# Patient Record
Sex: Male | Born: 1995 | Race: Black or African American | Hispanic: No | Marital: Single | Smoking: Never smoker
Health system: Southern US, Community
[De-identification: ages and names within clinical notes are randomized; demographics above are authoritative.]

## PROBLEM LIST (undated history)

## (undated) DIAGNOSIS — I10 Essential (primary) hypertension: Secondary | ICD-10-CM

## (undated) DIAGNOSIS — D573 Sickle-cell trait: Secondary | ICD-10-CM

---
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
## 2017-10-07 ENCOUNTER — Ambulatory Visit (INDEPENDENT_AMBULATORY_CARE_PROVIDER_SITE_OTHER): Payer: 59 | Admitting: Family Medicine

## 2017-10-07 ENCOUNTER — Encounter: Payer: Self-pay | Admitting: Family Medicine

## 2017-10-07 VITALS — BP 122/68 | HR 112 | Temp 99.4°F | Resp 18 | Ht 70.0 in | Wt 186.0 lb

## 2017-10-07 DIAGNOSIS — J069 Acute upper respiratory infection, unspecified: Secondary | ICD-10-CM | POA: Diagnosis not present

## 2017-10-07 DIAGNOSIS — J029 Acute pharyngitis, unspecified: Secondary | ICD-10-CM

## 2017-10-07 LAB — POC INFLUENZA A&B (BINAX/QUICKVUE)
Influenza A, POC: NEGATIVE
Influenza B, POC: NEGATIVE

## 2017-10-07 LAB — POCT RAPID STREP A (OFFICE): Rapid Strep A Screen: NEGATIVE

## 2017-10-07 NOTE — Progress Notes (Signed)
   2/4/20193:35 PM  William Lowe 01/11/1996, 22 y.o. male [id number]  Chief Complaint  Patient presents with  . Sore Throat    since Sunday with productive cough yellow sputum, subjective fevers and chills  . Otalgia    right ear fullness and pain since Sunday    HPI:   Patient is a 22 y.o. male who presents today for 2 days of sore throat, gets hot and cold, right ear pain. + Productive cough, yellow sputum. + nasal congestion. - SOB, nausea, vomiting, diarrhea, rashes Has been taking dayquil/nyquil and throat lozengers. Has not had flu vaccine this season. His mother has also been sick.   Depression screen PHQ 2/9 10/07/2017  Decreased Interest 0  Down, Depressed, Hopeless 0  PHQ - 2 Score 0    No Known Allergies  Prior to Admission medications   Not on File    History reviewed. No pertinent past medical history.  History reviewed. No pertinent surgical history.  Social History   Tobacco Use  . Smoking status: Never Smoker  . Smokeless tobacco: Never Used  Substance Use Topics  . Alcohol use: No    Frequency: Never    History reviewed. No pertinent family history.  ROS Per hpi  OBJECTIVE:  Blood pressure 122/68, pulse (!) 112, temperature 99.4 F (37.4 C), temperature source Oral, resp. rate 18, height 5\' 10"  (1.778 m), weight 186 lb (84.4 kg), SpO2 97 %.  Physical Exam  Constitutional: He is oriented to person, place, and time and well-developed, well-nourished, and in no distress.  HENT:  Head: Normocephalic and atraumatic.  Right Ear: Hearing, tympanic membrane, external ear and ear canal normal.  Left Ear: Hearing, tympanic membrane, external ear and ear canal normal.  Mouth/Throat: Mucous membranes are normal. Posterior oropharyngeal erythema present. No oropharyngeal exudate.  Eyes: Conjunctivae and EOM are normal. Pupils are equal, round, and reactive to light.  Neck: Neck supple.  Cardiovascular: Normal rate and regular rhythm. Exam reveals no  gallop and no friction rub.  No murmur heard. Pulmonary/Chest: Effort normal and breath sounds normal. He has no wheezes. He has no rales.  Lymphadenopathy:    He has no cervical adenopathy.  Neurological: He is alert and oriented to person, place, and time. Gait normal.  Skin: Skin is warm and dry.     Results for orders placed or performed in visit on 10/07/17 (from the past 24 hour(s))  POC Influenza A&B(BINAX/QUICKVUE)     Status: None   Collection Time: 10/07/17  3:48 PM  Result Value Ref Range   Influenza A, POC Negative Negative   Influenza B, POC Negative Negative  POCT rapid strep A     Status: None   Collection Time: 10/07/17  3:51 PM  Result Value Ref Range   Rapid Strep A Screen Negative Negative      ASSESSMENT and PLAN  1. URI with cough and congestion 2. Sore throat Discussed supportive measures for URI: increase hydration, rest, OTC medications, etc. RTC precautions discussed.  - POC Influenza A&B(BINAX/QUICKVUE) - POCT rapid strep A  Return if symptoms worsen or fail to improve.    Myles LippsIrma M Santiago, MD Primary Care at [facility] [address] [place], [place] [address] Ph.  [phone] Fax [phone]

## 2017-10-07 NOTE — Patient Instructions (Addendum)
     IF you received an x-ray today, you will receive an invoice from [facility]. Please contact [facility] at [phone] with questions or concerns regarding your invoice.   IF you received labwork today, you will receive an invoice from [facility]. Please contact [facility] at [phone] with questions or concerns regarding your invoice.   Our billing staff will not be able to assist you with questions regarding bills from these companies.  You will be contacted with the lab results as soon as they are available. The fastest way to get your results is to activate your My Chart account. Instructions are located on the last page of this paperwork. If you have not heard from us regarding the results in 2 weeks, please contact this office.     Upper Respiratory Infection, Adult Most upper respiratory infections (URIs) are caused by a virus. A URI affects the nose, throat, and upper air passages. The most common type of URI is often called "the common cold." Follow these instructions at home:  Take medicines only as told by your doctor.  Gargle warm saltwater or take cough drops to comfort your throat as told by your doctor.  Use a warm mist humidifier or inhale steam from a shower to increase air moisture. This may make it easier to breathe.  Drink enough fluid to keep your pee (urine) clear or pale yellow.  Eat soups and other clear broths.  Have a healthy diet.  Rest as needed.  Go back to work when your fever is gone or your doctor says it is okay. ? You may need to stay home longer to avoid giving your URI to others. ? You can also wear a face mask and wash your hands often to prevent spread of the virus.  Use your inhaler more if you have asthma.  Do not use any tobacco products, including cigarettes, chewing tobacco, or electronic cigarettes. If you need help quitting, ask your doctor. Contact a doctor if:  You are getting worse, not better.  Your  symptoms are not helped by medicine.  You have chills.  You are getting more short of breath.  You have brown or red mucus.  You have yellow or brown discharge from your nose.  You have pain in your face, especially when you bend forward.  You have a fever.  You have puffy (swollen) neck glands.  You have pain while swallowing.  You have white areas in the back of your throat. Get help right away if:  You have very bad or constant: ? Headache. ? Ear pain. ? Pain in your forehead, behind your eyes, and over your cheekbones (sinus pain). ? Chest pain.  You have long-lasting (chronic) lung disease and any of the following: ? Wheezing. ? Long-lasting cough. ? Coughing up blood. ? A change in your usual mucus.  You have a stiff neck.  You have changes in your: ? Vision. ? Hearing. ? Thinking. ? Mood. This information is not intended to replace advice given to you by your health care provider. Make sure you discuss any questions you have with your health care provider. Document Released: 02/06/2008 Document Revised: 04/22/2016 Document Reviewed: 11/25/2013 Elsevier Interactive Patient Education  2018 Elsevier Inc.  

## 2017-10-10 ENCOUNTER — Telehealth: Payer: Self-pay | Admitting: Family Medicine

## 2017-10-10 NOTE — Telephone Encounter (Signed)
Copied from CRM [id number]. Topic: General - Other >> Oct 10, 2017 10:44 AM Oneal GroutSebastian, Jennifer S wrote: Reason for CRM: Patient seen on Monday, he's throat is still hurting. Requesting something to be called in. [facility]

## 2017-10-10 NOTE — Telephone Encounter (Signed)
Pt's mother checking status of meds being called in

## 2017-10-10 NOTE — Telephone Encounter (Signed)
Patient's mom has called again and she's at the [facility] waiting on a prescription for her son to be called in.

## 2017-10-11 ENCOUNTER — Telehealth: Payer: Self-pay | Admitting: Family Medicine

## 2017-10-11 NOTE — Telephone Encounter (Signed)
Left detailed message with MD notes on vm per Hippa Release

## 2017-10-11 NOTE — Telephone Encounter (Signed)
Sorry but prednisone is not appropriate. He has a viral illness, supportive measures are indicated, such as pushing fluids and rest. If he is having high fevers, inability to maintain hydration, bloody diarrhea, shortness of breath, then he should be re-evaluated. Thanks

## 2017-10-11 NOTE — Telephone Encounter (Signed)
Caller name: Elyn AquasLinda Faison Relation to pt: mother  Call back number: [phone] Pharmacy: [facility]  Reason for call:  Mother 4x calling regarding requesting Prednisone, patient was seen 10/07/17 by Dr. Leretha PolSantiago, please advise

## 2017-10-11 NOTE — Telephone Encounter (Signed)
Copied from CRM [id number]. Topic: Quick Communication - See Telephone Encounter >> Oct 11, 2017  1:34 PM Cipriano BunkerLambe, Annette S wrote: CRM for notification. See Telephone encounter for:   Mom called saying his throat is hurting for 4 days now and not getting better.  Asking for something to help him.  Please call and let her know  [facility] [place] - [address] [place] [address] Phone: [phone] Fax: [phone]    10/11/17.

## 2017-10-11 NOTE — Telephone Encounter (Signed)
Patient was called because his mother called in to get something for his sore throat. I informed the patient and asked how was his throat feeling. He said "sometimes it's better, other times it's not." I advised that Dr. Leretha PolSantiago noted at the last OV 10/07/17 that if symptoms worsen or didn't improve to come back in to the office, I offered to make an appointment, he said "that's ok, I'm alright. If I don't get better, I will call you." I asked if he would call his mother to let her know his decision, he said "yes, I will."

## 2017-10-22 ENCOUNTER — Ambulatory Visit (INDEPENDENT_AMBULATORY_CARE_PROVIDER_SITE_OTHER): Payer: Worker's Compensation | Admitting: Urgent Care

## 2017-10-22 ENCOUNTER — Other Ambulatory Visit: Payer: Self-pay

## 2017-10-22 ENCOUNTER — Ambulatory Visit (INDEPENDENT_AMBULATORY_CARE_PROVIDER_SITE_OTHER): Payer: Worker's Compensation

## 2017-10-22 ENCOUNTER — Encounter: Payer: Self-pay | Admitting: Urgent Care

## 2017-10-22 VITALS — BP 122/78 | HR 106 | Temp 98.0°F | Resp 16 | Ht 69.69 in | Wt 191.0 lb

## 2017-10-22 DIAGNOSIS — S6702XA Crushing injury of left thumb, initial encounter: Secondary | ICD-10-CM | POA: Diagnosis not present

## 2017-10-22 DIAGNOSIS — Z026 Encounter for examination for insurance purposes: Secondary | ICD-10-CM

## 2017-10-22 DIAGNOSIS — S6392XA Sprain of unspecified part of left wrist and hand, initial encounter: Secondary | ICD-10-CM

## 2017-10-22 DIAGNOSIS — M79645 Pain in left finger(s): Secondary | ICD-10-CM | POA: Diagnosis not present

## 2017-10-22 DIAGNOSIS — M79642 Pain in left hand: Secondary | ICD-10-CM

## 2017-10-22 MED ORDER — MELOXICAM 7.5 MG PO TABS: 7.5000 mg | ORAL_TABLET | Freq: Every day | ORAL | 0 refills | Status: DC

## 2017-10-22 MED FILL — MELOXICAM 7.5 MG TABLET: 7.5 | 30 days supply | Qty: 30 | Fill #0

## 2017-10-22 NOTE — Progress Notes (Signed)
    MRN: [id number] DOB: 07/19/1996  Subjective:   William Lowe is a 22 y.o. male presenting for worker's comp visit. Reports suffering a left thumb/hand injury while trying to lift a couch. Patient lost control of the couch pulled his hand. Had pain of his left thumb, palm with some mild swelling. Denies bony deformity, warmth, redness.   Marsalis's medications list, allergies, past medical history and past surgical history were reviewed and excluded from this note due to being a worker's comp case.  Objective:   Vitals: BP 122/78   Pulse (!) 106   Temp 98 F (36.7 C) (Oral)   Resp 16   Ht 5' 9.69" (1.77 m)   Wt 191 lb (86.6 kg)   SpO2 98%   BMI 27.65 kg/m   Physical Exam  Constitutional: He is oriented to person, place, and time. He appears well-developed and well-nourished.  Cardiovascular: Normal rate.  Pulmonary/Chest: Effort normal.  Musculoskeletal:       Left hand: He exhibits normal range of motion, no tenderness, no bony tenderness, normal capillary refill, no deformity, no laceration and no swelling. Normal sensation noted. Normal strength noted.  Neurological: He is alert and oriented to person, place, and time.   Dg Finger Thumb Left  Result Date: 10/22/2017 CLINICAL DATA:  Injure left thumb at work yesterday. EXAM: LEFT THUMB 2+V COMPARISON:  None. FINDINGS: There is no evidence of fracture or dislocation. There is no evidence of arthropathy or other focal bone abnormality. Soft tissues are unremarkable IMPRESSION: Negative. Electronically Signed   By: Sherian ReinWei-Chen  Lin M.D.   On: 10/22/2017 13:43   Assessment and Plan :   Hand sprain, left, initial encounter  Pain of left thumb  Left hand pain - Plan: DG Finger Thumb Left  Encounter related to worker's compensation claim  Will start conservative management. Use meloxicam, rest, icing after work days. Return-to-clinic precautions discussed, patient verbalized understanding. Otherwise, follow up in 1 week.   Wallis BambergMario  Ivie Maese, PA-C Primary Care at [facility] [phone] 10/22/2017 1:32 PM

## 2017-10-22 NOTE — Patient Instructions (Addendum)
Intermetacarpal Sprain An intermetacarpal sprain happens when tissues between bones in the hand (metacarpals) become overstretched or torn(ruptured). This usually happens because of an injury to the hand. Intermetacarpal sprains range from mild to severe. They can take up to 2-12 weeks to heal, with proper treatment. What are the causes? This injury is caused by excess pressure or strain (stress) that is applied to the intermetacarpal ligaments. This often happens because of a hard, direct hit or injury (trauma) to the hand. What increases the risk? The following factors may make you more likely to develop this injury:  A previous hand injury.  Doing repetitive motions with your hands, such as movements in sports or heavy labor.  Having poor strength and flexibility in your hands.  What are the signs or symptoms? Symptoms of this injury may include:  A feeling of popping or tearing inside the hand.  Pain and inflammation, especially in the knuckles.  Bruising.  Limited range of motion of the hand.  How is this diagnosed? This injury is diagnosed based on a physical exam and your medical history. You may have X-rays to check for breaks (fractures) in your bones. Your sprain may be rated in degrees, based on how severe it is. The ratings include:  First-degree. A ligamentis stretched but it still has its normal shape.  Second-degree. A ligament is partially ruptured, and you may have some difficulty moving your hand normally.  Third-degree. A ligament is completely ruptured, and you may not be able to move the affected hand.  How is this treated? This injury is treated by resting, icing, raising (elevating), and applying pressure (compression) to the injured area. Depending on the severity of your sprain, treatment may also include:  Medicines that help to relieve pain.  Keeping your hand in a fixed position (immobilization) for a period of time. This may be done using a  bandage (dressing), a cast, or a splint.  Exercises to strengthen and stretch your hand. You may be referred to a physical therapist.  Surgery. This is rare.  Follow these instructions at home: If you have a cast:  Do not stick anything inside the cast to scratch your skin. Doing that increases your risk of infection.  Check the skin around the cast every day. Report any concerns to your health care provider. You may put lotion on dry skin around the edges of the cast. Do not apply lotion to the skin underneath the cast.  Do not let your cast get wet if it is not waterproof.  Keep the cast clean. If you have a splint:  Wear the splint as told by your health care provider. Remove it only as told by your health care provider.  Loosen the splint if your fingers tingle, become numb, or turn cold and blue.  Do not let your splint get wet if it is not waterproof.  Keep the splint clean. Bathing  If you have a cast, splint, or dressing, do not take baths, swim, or use a hot tub until your health care provider approves. Ask your health care provider if you can take showers. You may only be allowed to take sponge baths for bathing.  If you have a cast or splint that is not waterproof, cover it with a watertight plastic bag when you take a bath or a shower. Managing pain, stiffness, and swelling  If directed, apply ice to the injured area: ? Put ice in a plastic bag. ? Place a towel between your skin  and the bag. ? Leave the ice on for 20 minutes, 2-3 times per day.  Move your fingers often to avoid stiffness and to lessen swelling.  Elevate your hand above the level of your heart while you are sitting or lying down.  Wear a compression wrap only as told by your health care provider. Driving  Do not drive or operate heavy machinery while taking prescription pain medicine.  Ask your health care provider when it is safe to drive if you have a cast or splint on a hand that you use  for driving. Activity  Return to your normal activities as told by your health care provider. Ask your health care provider what activities are safe for you.  Avoid activities that cause pain or make your condition worse.  Do exercises as told by your health care provider or physical therapist. General instructions  Take over-the-counter and prescription medicines only as told by your health care provider.  If you have a cast or a splint, do not put pressure on any part of the cast or splint until it is fully hardened. This may take several hours.  Do not wear rings on the fingers of your injured hand.  Keep all follow-up visits as told by your health care provider. This is important. Contact a health care provider if:  You have symptoms that do not get better after 2 weeks of treatment.  You have more redness, swelling, or pain in your injured area.  You have a fever.  Your cast or splint gets damaged. Get help right away if:  You have severe pain.  You develop numbness in your hand or fingers.  You cannot move your hand or fingers.  Your hand or fingers feel unusually cold.  Your hand or fingers turn blue.  Your fingernails turn a dark color, such as blue or gray. This information is not intended to replace advice given to you by your health care provider. Make sure you discuss any questions you have with your health care provider. Document Released: 08/20/2005 Document Revised: 01/26/2016 Document Reviewed: 02/07/2015 Elsevier Interactive Patient Education  2018 [employer]     IF you received an x-ray today, you will receive an invoice from [facility]. Please contact [facility] at [phone] with questions or concerns regarding your invoice.   IF you received labwork today, you will receive an invoice from [facility]. Please contact [facility] at [phone] with questions or concerns regarding your invoice.   Our billing staff will not be  able to assist you with questions regarding bills from these companies.  You will be contacted with the lab results as soon as they are available. The fastest way to get your results is to activate your My Chart account. Instructions are located on the last page of this paperwork. If you have not heard from us regarding the results in 2 weeks, please contact this office.

## 2017-10-29 ENCOUNTER — Encounter: Payer: Self-pay | Admitting: Urgent Care

## 2017-10-29 ENCOUNTER — Ambulatory Visit (INDEPENDENT_AMBULATORY_CARE_PROVIDER_SITE_OTHER): Payer: Worker's Compensation | Admitting: Urgent Care

## 2017-10-29 VITALS — BP 122/68 | HR 60 | Temp 97.8°F | Resp 18 | Ht 69.0 in | Wt 188.6 lb

## 2017-10-29 DIAGNOSIS — M79645 Pain in left finger(s): Secondary | ICD-10-CM | POA: Diagnosis not present

## 2017-10-29 DIAGNOSIS — S6392XD Sprain of unspecified part of left wrist and hand, subsequent encounter: Secondary | ICD-10-CM | POA: Diagnosis not present

## 2017-10-29 DIAGNOSIS — Z026 Encounter for examination for insurance purposes: Secondary | ICD-10-CM

## 2017-10-29 DIAGNOSIS — M79642 Pain in left hand: Secondary | ICD-10-CM

## 2017-10-29 NOTE — Patient Instructions (Addendum)
Intermetacarpal Sprain An intermetacarpal sprain happens when tissues between bones in the hand (metacarpals) become overstretched or torn(ruptured). This usually happens because of an injury to the hand. Intermetacarpal sprains range from mild to severe. They can take up to 2-12 weeks to heal, with proper treatment. What are the causes? This injury is caused by excess pressure or strain (stress) that is applied to the intermetacarpal ligaments. This often happens because of a hard, direct hit or injury (trauma) to the hand. What increases the risk? The following factors may make you more likely to develop this injury:  A previous hand injury.  Doing repetitive motions with your hands, such as movements in sports or heavy labor.  Having poor strength and flexibility in your hands.  What are the signs or symptoms? Symptoms of this injury may include:  A feeling of popping or tearing inside the hand.  Pain and inflammation, especially in the knuckles.  Bruising.  Limited range of motion of the hand.  How is this diagnosed? This injury is diagnosed based on a physical exam and your medical history. You may have X-rays to check for breaks (fractures) in your bones. Your sprain may be rated in degrees, based on how severe it is. The ratings include:  First-degree. A ligamentis stretched but it still has its normal shape.  Second-degree. A ligament is partially ruptured, and you may have some difficulty moving your hand normally.  Third-degree. A ligament is completely ruptured, and you may not be able to move the affected hand.  How is this treated? This injury is treated by resting, icing, raising (elevating), and applying pressure (compression) to the injured area. Depending on the severity of your sprain, treatment may also include:  Medicines that help to relieve pain.  Keeping your hand in a fixed position (immobilization) for a period of time. This may be done using a  bandage (dressing), a cast, or a splint.  Exercises to strengthen and stretch your hand. You may be referred to a physical therapist.  Surgery. This is rare.  Follow these instructions at home: If you have a cast:  Do not stick anything inside the cast to scratch your skin. Doing that increases your risk of infection.  Check the skin around the cast every day. Report any concerns to your health care provider. You may put lotion on dry skin around the edges of the cast. Do not apply lotion to the skin underneath the cast.  Do not let your cast get wet if it is not waterproof.  Keep the cast clean. If you have a splint:  Wear the splint as told by your health care provider. Remove it only as told by your health care provider.  Loosen the splint if your fingers tingle, become numb, or turn cold and blue.  Do not let your splint get wet if it is not waterproof.  Keep the splint clean. Bathing  If you have a cast, splint, or dressing, do not take baths, swim, or use a hot tub until your health care provider approves. Ask your health care provider if you can take showers. You may only be allowed to take sponge baths for bathing.  If you have a cast or splint that is not waterproof, cover it with a watertight plastic bag when you take a bath or a shower. Managing pain, stiffness, and swelling  If directed, apply ice to the injured area: ? Put ice in a plastic bag. ? Place a towel between your skin  and the bag. ? Leave the ice on for 20 minutes, 2-3 times per day.  Move your fingers often to avoid stiffness and to lessen swelling.  Elevate your hand above the level of your heart while you are sitting or lying down.  Wear a compression wrap only as told by your health care provider. Driving  Do not drive or operate heavy machinery while taking prescription pain medicine.  Ask your health care provider when it is safe to drive if you have a cast or splint on a hand that you use  for driving. Activity  Return to your normal activities as told by your health care provider. Ask your health care provider what activities are safe for you.  Avoid activities that cause pain or make your condition worse.  Do exercises as told by your health care provider or physical therapist. General instructions  Take over-the-counter and prescription medicines only as told by your health care provider.  If you have a cast or a splint, do not put pressure on any part of the cast or splint until it is fully hardened. This may take several hours.  Do not wear rings on the fingers of your injured hand.  Keep all follow-up visits as told by your health care provider. This is important. Contact a health care provider if:  You have symptoms that do not get better after 2 weeks of treatment.  You have more redness, swelling, or pain in your injured area.  You have a fever.  Your cast or splint gets damaged. Get help right away if:  You have severe pain.  You develop numbness in your hand or fingers.  You cannot move your hand or fingers.  Your hand or fingers feel unusually cold.  Your hand or fingers turn blue.  Your fingernails turn a dark color, such as blue or gray. This information is not intended to replace advice given to you by your health care provider. Make sure you discuss any questions you have with your health care provider. Document Released: 08/20/2005 Document Revised: 01/26/2016 Document Reviewed: 02/07/2015 Elsevier Interactive Patient Education  2018 Elsevier Inc.     IF you received an x-ray today, you will receive an invoice from [facility]. Please contact [facility] at [phone] with questions or concerns regarding your invoice.   IF you received labwork today, you will receive an invoice from [facility]. Please contact [facility] at [phone] with questions or concerns regarding your invoice.   Our billing staff will not be  able to assist you with questions regarding bills from these companies.  You will be contacted with the lab results as soon as they are available. The fastest way to get your results is to activate your My Chart account. Instructions are located on the last page of this paperwork. If you have not heard from us regarding the results in 2 weeks, please contact this office.      

## 2017-10-29 NOTE — Progress Notes (Signed)
   MRN: [id number] DOB: 01/26/1996  Subjective:   Wynetta FinesDante M Weihe is a 22 y.o. male presenting for worker's comp visit. Reports significant improvement in his pain of his left thumb. Denies fever, redness, bony deformity, swelling, warmth. He is using meloxicam with good results.  Shizuo's medications list, allergies, past medical history and past surgical history were reviewed and excluded from this note due to being a worker's comp case.  Objective:   Vitals: BP 122/68   Pulse 60   Temp 97.8 F (36.6 C) (Oral)   Resp 18   Ht 5\' 9"  (1.753 m)   Wt 188 lb 9.6 oz (85.5 kg)   SpO2 100%   BMI 27.85 kg/m   Physical Exam  Constitutional: He is oriented to person, place, and time. He appears well-developed and well-nourished.  Cardiovascular: Normal rate.  Pulmonary/Chest: Effort normal.  Musculoskeletal:       Left hand: He exhibits normal range of motion, no tenderness, no bony tenderness, normal capillary refill, no deformity, no laceration and no swelling. Normal sensation noted. Normal strength noted.  Neurological: He is alert and oriented to person, place, and time.   Assessment and Plan :   Hand sprain, left, subsequent encounter  Pain of left thumb  Left hand pain  Encounter related to worker's compensation claim  Follow up as needed. Work restrictions lifted. Use meloxicam as needed.  Wallis BambergMario Aneeka Bowden, PA-C [facility] [phone] 10/29/2017 1:51 PM

## 2018-06-09 IMAGING — DX DG FINGER THUMB 2+V*L*
3 series · 3 of 3 positions shown · non-contrast
Comparison: None.

CLINICAL DATA: Injure left thumb at work yesterday.

EXAM:
LEFT THUMB 2+V

[finger ap]
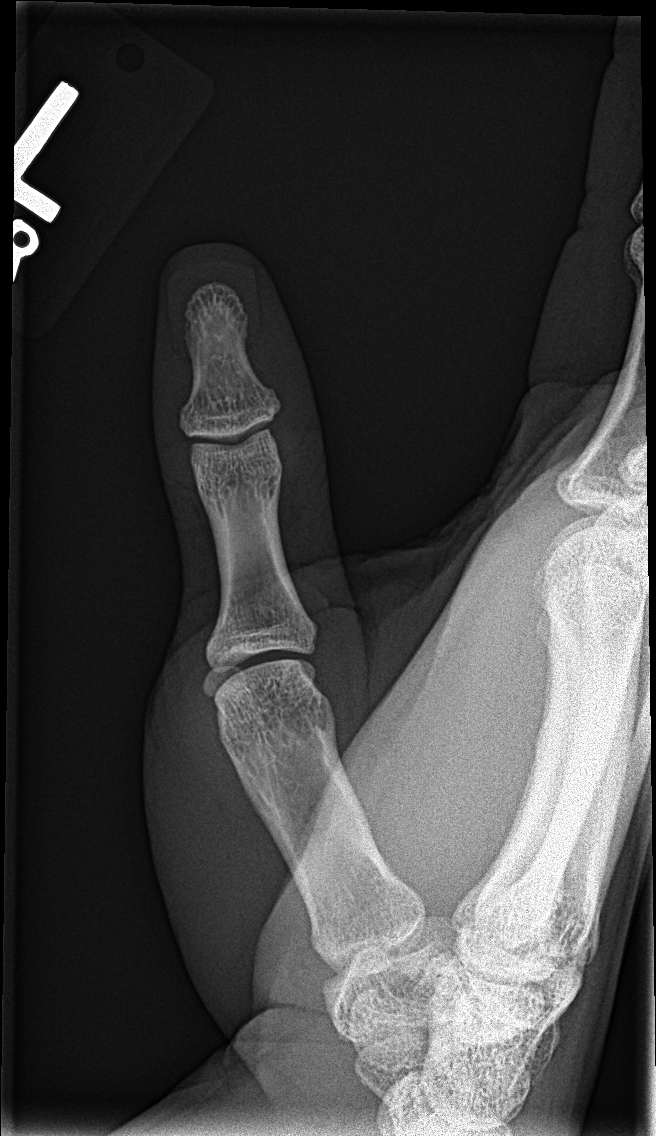

[finger obl]
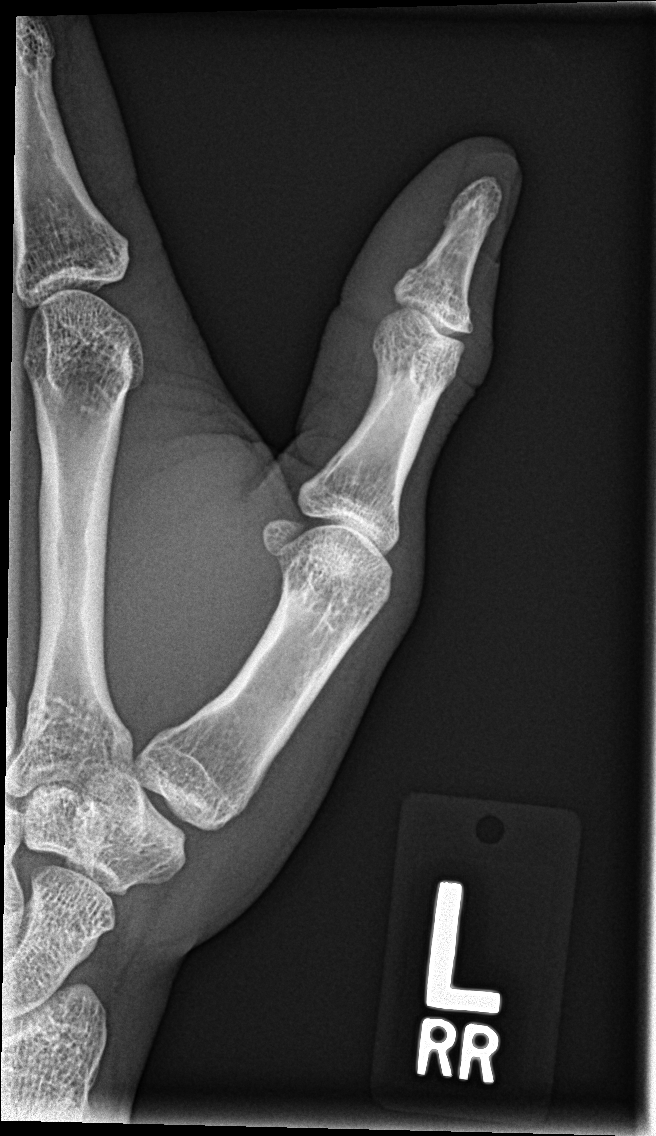

[finger lat]
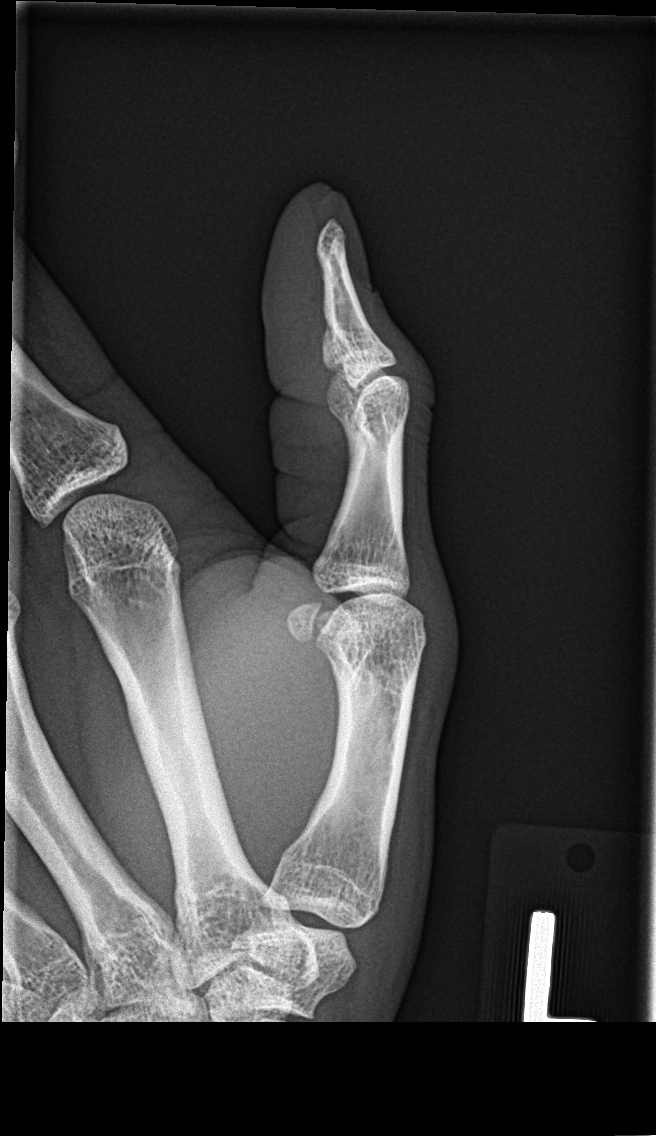

[3 of 3 positions shown; findings below may reference images not displayed]

FINDINGS: There is no evidence of fracture or dislocation. There is no
evidence of arthropathy or other focal bone abnormality. Soft
tissues are unremarkable
IMPRESSION: Negative.

## 2018-07-16 ENCOUNTER — Ambulatory Visit: Payer: Self-pay | Admitting: Emergency Medicine

## 2018-07-16 ENCOUNTER — Encounter: Payer: Self-pay | Admitting: Emergency Medicine

## 2018-07-16 ENCOUNTER — Other Ambulatory Visit: Payer: Self-pay

## 2018-07-16 VITALS — BP 121/75 | HR 88 | Temp 98.4°F | Resp 16 | Ht 68.5 in | Wt 185.2 lb

## 2018-07-16 DIAGNOSIS — Z024 Encounter for examination for driving license: Secondary | ICD-10-CM

## 2018-07-16 NOTE — Progress Notes (Signed)
This patient presents for DOT examination for fitness for duty.   Medical History:  1. Head/Brain Injuries, disorders or illnesses no  2. Seizures, epilepsy no  3. Eye disorders or impaired vision (except corrective lenses) no  4. Ear disorders, loss of hearing or balance no  5. Heart disease or heart attack, other cardiovascular condition no  6. Heart surgery (valve replacement/bypass, angioplasty, pacemaker/defribrillator) no  7. High blood pressure no  8. High cholesterol no  9. Chronic cough, shortness of breath or other breathing problems no  10. Lung disease (emphysema, asthma or chronic bronchitis) no  11. Kidney disease, dialysis no  12. Digestive problems  no  13. Diabetes or elevated blood sugar no                      If yes to #13, Insulin use no  14. Nervious or psychiatric disorders, e.g., severe depression no  15. Fainting or syncope no  16. Dizziness, headaches, numbness, tingling or memory loss no  17. Unexplained weight loss no  18. Stroke, TIA or paralysis no  19. Missing or impaired hand, arm, foot, leg, finger, toe no  20. Spinal injury or disease no  21. Bone, muscles or nerve problems no  22. Blood clots or bleeding bleeding disorders no  23. Cancer no  24. Chronic infection or other chronic diseases no  25. Sleep disorders, pauses in breathing while asleep, daytime sleepiness, loud snoring no  26. Have you ever had a sleep test? no  27.  Have you ever spent a night in the hospital? no  28. Have you ever had a broken bone? no  29. Have you or or do you use tobacco products? no  30. Regular, frequent alcohol use no  31. Illegal substance use within the past 2 years no  32.  Have you ever failed a drug test or been dependent on an illegal substance? no   Current Medications: Prior to Admission medications   Medication Sig Start Date End Date Taking? Authorizing Provider  meloxicam (MOBIC) 7.5 MG tablet Take 1 tablet (7.5 mg total) by mouth daily.  10/22/17   Wallis BambergMani, Mario, PA-C    Medical Examiner's Comments on Health History:  In good general medical condition.  TESTING:   Visual Acuity Screening   Right eye Left eye Both eyes  Without correction: 20/20 20/20 20/20   With correction:     Comments: Colors:  6/6 Periph: R eye 85 degrees  L eye 85 degrees  Hearing Screening Comments: Whisper test: R ear 10 ft  L ear 10 ft  Monocular Vision: No.  Hearing Aid used for test: No. Hearing Aid required to to meet standard: No.  BP 121/75   Pulse 88   Temp 98.4 F (36.9 C) (Oral)   Resp 16   Ht 5' 8.5" (1.74 m)   Wt 185 lb 3.2 oz (84 kg)   SpO2 97%   BMI 27.75 kg/m  Pulse rate is regular     PHYSICAL EXAMINATION:  General Appearance Not markedly obese. No tremor, signs of alcoholism, problem drinking or drug abuse.   Skin Warm, dry and intact.  Eyes Pupils are equal, round and reactive to light and accommodation, extraocular movements are intact. No exophthalmos, no nystagmus.  Ears Normal external ears. External canal without occlusion. No scarring of the TM. No perforation of the TM.  Mouth and Throat Clear and moist. No irremedial deformities likely to interfere with breathing or  swallowing.  Heart No murmurs, extra sounds, evidence of cardiomegaly. No pacemaker. No implantable defibrillator.  Lungs and Chest (excluding breasts) Normal chest expansion, respiratory rate, breath sounds. No cyanosis.  Abdomen and Viscera No liver enlargement. No splenic enlargement. No masses, bruits, hernias or significant abdominal wall weakness.  Genitourinary  No inguinal or femoral hernia.  Spine and other musculoskeletal No tenderness, no limitation of motion, no deformities. No evidence of previous surgery.  Extremities No loss or impairment of leg, foot, toe, arm, hand, finger. No perceptible limp, deformities, atrophy, weakness, paralysis, clubbing, edema, hypotonia. Patient has sufficient grasp and prehension to maintain steering  wheel grip. Patient has sufficient mobility and strength in the lower limbs to operate pedals properly.  Neurologic Normal equilibrium, coordination, speech pattern. No paresthesia, asymmetry of deep tendon reflexes, sensory or positional abnormalities. No abnormality of patellar or Babinski's reflexes.  Gait Not antalgic or ataxic  Vascular Normal pulses. No carotid or arterial bruits. No varicose veins.     Certification Status: does meet standards for 2 year certificate.  Certification expires 07/16/2020.

## 2018-07-16 NOTE — Patient Instructions (Addendum)

## 2018-08-14 ENCOUNTER — Encounter (HOSPITAL_COMMUNITY): Payer: Self-pay | Admitting: Emergency Medicine

## 2018-08-14 ENCOUNTER — Other Ambulatory Visit: Payer: Self-pay

## 2018-08-14 ENCOUNTER — Ambulatory Visit (HOSPITAL_COMMUNITY)
Admission: EM | Admit: 2018-08-14 | Discharge: 2018-08-14 | Disposition: A | Discharge status: Home / Self Care | Payer: 59 | Attending: Family Medicine | Admitting: Family Medicine

## 2018-08-14 DIAGNOSIS — R69 Illness, unspecified: Secondary | ICD-10-CM | POA: Diagnosis not present

## 2018-08-14 DIAGNOSIS — J111 Influenza due to unidentified influenza virus with other respiratory manifestations: Secondary | ICD-10-CM

## 2018-08-14 DIAGNOSIS — R05 Cough: Secondary | ICD-10-CM | POA: Insufficient documentation

## 2018-08-14 DIAGNOSIS — R059 Cough, unspecified: Secondary | ICD-10-CM

## 2018-08-14 MED ORDER — HYDROCODONE-HOMATROPINE 5-1.5 MG/5ML PO SYRP: 5.0000 mL | ORAL_SOLUTION | Freq: Four times a day (QID) | ORAL | 0 refills | Status: AC | PRN

## 2018-08-14 MED ORDER — IBUPROFEN 800 MG PO TABS
800.0000 mg | ORAL_TABLET | Freq: Once | ORAL | Status: AC
  Administered 2018-08-14: 800 mg via ORAL

## 2018-08-14 MED ORDER — IBUPROFEN 800 MG PO TABS
ORAL_TABLET | ORAL | Status: AC
  Filled 2018-08-14: qty 1

## 2018-08-14 NOTE — Discharge Instructions (Signed)

## 2018-08-14 NOTE — ED Triage Notes (Signed)
PT reports cough, congestion, fever, bodyaches since Monday.

## 2018-08-14 NOTE — ED Provider Notes (Signed)
[facility]   [id number] 08/14/18 Arrival Time: 1706  ASSESSMENT & PLAN:  1. Influenza-like illness   2. Cough   See AVS for d/c instructions.  Meds ordered this encounter  Medications  . ibuprofen (ADVIL,MOTRIN) tablet 800 mg  . HYDROcodone-homatropine (HYCODAN) 5-1.5 MG/5ML syrup    Sig: Take 5 mLs by mouth every 6 (six) hours as needed for cough.    Dispense:  90 mL    Refill:  0   Cough medication sedation precautions. Discussed typical duration of symptoms. OTC symptom care as needed. Ensure adequate fluid intake and rest. May f/u with PCP or here as needed.  Reviewed expectations re: course of current medical issues. Questions answered. Outlined signs and symptoms indicating need for more acute intervention. Patient verbalized understanding. After Visit Summary given.   SUBJECTIVE: History from: patient.  William Lowe is a 22 y.o. male who presents with complaint of nasal congestion, post-nasal drainage, and a persistent dry cough. Onset abrupt, about 3 days ago. Overall with fatigue and with body aches. SOB: none. Wheezing: none. Fever: yes, subjective with chills. Overall normal PO intake without n/v. Sick contacts: no. No specific or significant aggravating or alleviating factors reported. OTC treatment: none reported.  Received flu shot this year: no.  Social History   Tobacco Use  Smoking Status Never Smoker  Smokeless Tobacco Never Used    ROS: As per HPI.   OBJECTIVE:  Vitals:   08/14/18 1811  BP: (!) 120/56  Pulse: (!) 103  Resp: 16  Temp: (!) 103 F (39.4 C)  TempSrc: Oral  SpO2: 99%     General appearance: alert; appears fatigued HEENT: nasal congestion; clear runny nose; throat irritation secondary to post-nasal drainage Neck: supple without LAD Lungs: unlabored respirations, symmetrical air entry without wheezing; cough: moderate Psychological: alert and cooperative; normal mood and affect   No Known  Allergies  PMH: L hand sprain.  Social History   Socioeconomic History  . Marital status: Single    Spouse name: Not on file  . Number of children: Not on file  . Years of education: Not on file  . Highest education level: Not on file  Occupational History  . Not on file  Social Needs  . Financial resource strain: Not on file  . Food insecurity:    Worry: Not on file    Inability: Not on file  . Transportation needs:    Medical: Not on file    Non-medical: Not on file  Tobacco Use  . Smoking status: Never Smoker  . Smokeless tobacco: Never Used  Substance and Sexual Activity  . Alcohol use: No    Frequency: Never  . Drug use: No  . Sexual activity: Not on file  Lifestyle  . Physical activity:    Days per week: Not on file    Minutes per session: Not on file  . Stress: Not on file  Relationships  . Social connections:    Talks on phone: Not on file    Gets together: Not on file    Attends religious service: Not on file    Active member of club or organization: Not on file    Attends meetings of clubs or organizations: Not on file    Relationship status: Not on file  . Intimate partner violence:    Fear of current or ex partner: Not on file    Emotionally abused: Not on file    Physically abused: Not on file  Forced sexual activity: Not on file  Other Topics Concern  . Not on file  Social History Narrative  . Not on file           Mardella Layman, MD 08/15/18 [phone]

## 2018-08-14 NOTE — ED Triage Notes (Signed)
Wishes to see provider before a strep test is done

## 2018-08-17 ENCOUNTER — Other Ambulatory Visit: Payer: Self-pay

## 2018-08-17 ENCOUNTER — Emergency Department (HOSPITAL_COMMUNITY)
Admission: EM | Admit: 2018-08-17 | Discharge: 2018-08-17 | Disposition: A | Discharge status: Home / Self Care | Payer: 59 | Attending: Emergency Medicine | Admitting: Emergency Medicine

## 2018-08-17 ENCOUNTER — Encounter (HOSPITAL_COMMUNITY): Payer: Self-pay | Admitting: Emergency Medicine

## 2018-08-17 DIAGNOSIS — B370 Candidal stomatitis: Secondary | ICD-10-CM | POA: Diagnosis not present

## 2018-08-17 DIAGNOSIS — Z79899 Other long term (current) drug therapy: Secondary | ICD-10-CM | POA: Insufficient documentation

## 2018-08-17 DIAGNOSIS — M791 Myalgia, unspecified site: Secondary | ICD-10-CM | POA: Diagnosis not present

## 2018-08-17 DIAGNOSIS — K1321 Leukoplakia of oral mucosa, including tongue: Secondary | ICD-10-CM | POA: Diagnosis present

## 2018-08-17 MED ORDER — NYSTATIN 100000 UNIT/ML MT SUSP: 500000.0000 [IU] | Freq: Four times a day (QID) | OROMUCOSAL | 0 refills | Status: DC

## 2018-08-17 MED ORDER — FLUCONAZOLE 200 MG PO TABS
200.0000 mg | ORAL_TABLET | Freq: Once | ORAL | Status: AC
  Administered 2018-08-17: 200 mg via ORAL
  Filled 2018-08-17: qty 1

## 2018-08-17 NOTE — Discharge Instructions (Signed)
Put the medication on your tongue and let it soak for a minute and then swallow. Call your doctor tomorrow for follow up.

## 2018-08-17 NOTE — ED Provider Notes (Signed)
[facility]-EMERGENCY DEPT Provider Note   CSN: [id number] Arrival date & time: 08/17/18  1849     History   Chief Complaint No chief complaint on file.   HPI William Lowe is a 22 y.o. male who presents to the ED for a coated tongue. Patient reports his tongue is sore. He has had the flu for the past week and was evaluated and treated at Urgent Care but he can not get rid of the coating on his tongue that he thinks is thrush. Patient denies having any immuno comprising conditions.  Patient reports he has brushed his tongue and teeth but can not get the coating off his tongue.   HPI  History reviewed. No pertinent past medical history.  There are no active problems to display for this patient.   History reviewed. No pertinent surgical history.      Home Medications    Prior to Admission medications   Medication Sig Start Date End Date Taking? Authorizing Provider  HYDROcodone-homatropine (HYCODAN) 5-1.5 MG/5ML syrup Take 5 mLs by mouth every 6 (six) hours as needed for cough. 08/14/18   Mardella LaymanHagler, Brian, MD  meloxicam (MOBIC) 7.5 MG tablet Take 1 tablet (7.5 mg total) by mouth daily. 10/22/17   Wallis BambergMani, Mario, PA-C  nystatin (MYCOSTATIN) 100000 UNIT/ML suspension Take 5 mLs (500,000 Units total) by mouth 4 (four) times daily. 08/17/18   Janne NapoleonNeese, Cloa Bushong M, NP    Family History No family history on file.  Social History Social History   Tobacco Use  . Smoking status: Never Smoker  . Smokeless tobacco: Never Used  Substance Use Topics  . Alcohol use: No    Frequency: Never  . Drug use: No     Allergies   Patient has no known allergies.   Review of Systems Review of Systems  Constitutional:       Patient reports he continues to have mild cough and body aches but has improved significantly since dx with flu.   HENT:       Coated tongue and sore tongue  Musculoskeletal: Positive for myalgias.     Physical Exam Updated Vital Signs BP (!)  142/87 (BP Location: Right Arm)   Pulse 92   Temp 99.2 F (37.3 C) (Oral)   Resp 18   SpO2 99%   Physical Exam Vitals signs and nursing note reviewed.  Constitutional:      Appearance: He is well-developed.  HENT:     Nose: Congestion present.     Mouth/Throat:     Mouth: Mucous membranes are moist.     Tongue: Tongue lesions: thick white coating      Pharynx: Oropharynx is clear.  Neck:     Musculoskeletal: Neck supple. No neck rigidity.  Cardiovascular:     Rate and Rhythm: Normal rate and regular rhythm.  Pulmonary:     Effort: Pulmonary effort is normal.     Breath sounds: Normal breath sounds.  Musculoskeletal: Normal range of motion.  Lymphadenopathy:     Cervical: No cervical adenopathy.  Skin:    General: Skin is warm and dry.  Neurological:     Mental Status: He is alert and oriented to person, place, and time.     Cranial Nerves: No cranial nerve deficit.  Psychiatric:        Mood and Affect: Mood normal.      ED Treatments / Results  Labs (all labs ordered are listed, but only abnormal results are displayed) Labs Reviewed -  No data to display  Radiology No results found.  Procedures Procedures (including critical care time)  Medications Ordered in ED Medications  fluconazole (DIFLUCAN) tablet 200 mg (200 mg Oral Given 08/17/18 2040)     Initial Impression / Assessment and Plan / ED Course  I have reviewed the triage vital signs and the nursing notes. 22 y.o. male here with pain and coating of his tongue stable for d/c. Discussed in detail with the patient possible causes of thrush and need for f/u with PCP. Offered patient HIV screening and he declined stating that he will see is PCP tomorrow and discuss plan.   Final Clinical Impressions(s) / ED Diagnoses   Final diagnoses:  Thrush, oral    ED Discharge Orders         Ordered    nystatin (MYCOSTATIN) 100000 UNIT/ML suspension  4 times daily     08/17/18 2045           Kerrie Buffalo  [place], [place] 08/17/18 2050    Tilden Fossa, MD 08/20/18 0900

## 2018-08-17 NOTE — ED Triage Notes (Signed)
Patient c/o tongue pain x2days. Tongue appears white.

## 2018-09-21 ENCOUNTER — Emergency Department (HOSPITAL_COMMUNITY)
Admission: EM | Admit: 2018-09-21 | Discharge: 2018-09-21 | Disposition: A | Discharge status: Home / Self Care | Payer: 59 | Attending: Emergency Medicine | Admitting: Emergency Medicine

## 2018-09-21 ENCOUNTER — Encounter (HOSPITAL_COMMUNITY): Payer: Self-pay | Admitting: *Deleted

## 2018-09-21 ENCOUNTER — Other Ambulatory Visit: Payer: Self-pay

## 2018-09-21 DIAGNOSIS — L0390 Cellulitis, unspecified: Secondary | ICD-10-CM

## 2018-09-21 DIAGNOSIS — M79671 Pain in right foot: Secondary | ICD-10-CM | POA: Diagnosis present

## 2018-09-21 DIAGNOSIS — L03115 Cellulitis of right lower limb: Secondary | ICD-10-CM | POA: Diagnosis not present

## 2018-09-21 MED ORDER — CLINDAMYCIN HCL 300 MG PO CAPS: 300.0000 mg | ORAL_CAPSULE | Freq: Four times a day (QID) | ORAL | 0 refills | Status: DC

## 2018-09-21 MED ORDER — IBUPROFEN 800 MG PO TABS: 800.0000 mg | ORAL_TABLET | Freq: Three times a day (TID) | ORAL | 0 refills | Status: DC

## 2018-09-21 MED ORDER — KETOROLAC TROMETHAMINE 60 MG/2ML IM SOLN
60.0000 mg | Freq: Once | INTRAMUSCULAR | Status: AC
  Administered 2018-09-21: 60 mg via INTRAMUSCULAR
  Filled 2018-09-21: qty 2

## 2018-09-21 MED ORDER — CLINDAMYCIN HCL 300 MG PO CAPS
300.0000 mg | ORAL_CAPSULE | Freq: Once | ORAL | Status: AC
  Administered 2018-09-21: 300 mg via ORAL
  Filled 2018-09-21: qty 1

## 2018-09-21 NOTE — ED Triage Notes (Signed)
Pt c/o right foot pain to plantar surface that started Saturday.

## 2018-09-21 NOTE — ED Notes (Signed)
Bed: WTR5 Expected date:  Expected time:  Means of arrival:  Comments: 

## 2018-09-21 NOTE — ED Provider Notes (Signed)
Emergency Department Provider Note   I have reviewed the triage vital signs and the nursing notes.   HISTORY  Chief Complaint Foot Pain (right)   HPI William Lowe is a 23 y.o. male had a small swollen area to the heel of his right foot.  He tried "popping" and "digging it out" with a needle and worsening pain and did not improve so presented here for further evaluation.  No fevers, nausea, vomiting.  No other associated symptoms.  Has not tried any else for the symptoms.  Has not seen anyone else for the symptoms.  No pain or abnormal rashes anywhere else. No other associated or modifying symptoms.    History reviewed. No pertinent past medical history.  There are no active problems to display for this patient.   History reviewed. No pertinent surgical history.  Current Outpatient Rx  . Order #: [id number] Class: Print  . Order #: [id number] Class: Normal  . Order #: [id number] Class: Print  . Order #: [id number] Class: Normal  . Order #: [id number] Class: Print    Allergies Patient has no known allergies.  No family history on file.  Social History Social History   Tobacco Use  . Smoking status: Never Smoker  . Smokeless tobacco: Never Used  Substance Use Topics  . Alcohol use: No    Frequency: Never  . Drug use: No    Review of Systems  All other systems negative except as documented in the HPI. All pertinent positives and negatives as reviewed in the HPI. ____________________________________________   PHYSICAL EXAM:  VITAL SIGNS: ED Triage Vitals  Enc Vitals Group     BP 09/21/18 0339 (!) 146/74     Pulse Rate 09/21/18 0339 (!) 108     Resp 09/21/18 0339 18     Temp 09/21/18 0339 99.6 F (37.6 C)     Temp Source 09/21/18 0339 Oral     SpO2 09/21/18 0339 100 %     Weight 09/21/18 0339 185 lb (83.9 kg)     Height 09/21/18 0339 5\' 10"  (1.778 m)     Head Circumference --      Peak Flow --      Pain Score 09/21/18 0347 5     Pain Loc --      Pain Edu?  --      Excl. in GC? --     Constitutional: Alert and oriented. Well appearing and in no acute distress. Eyes: Conjunctivae are normal. PERRL. EOMI. Head: Atraumatic. Nose: No congestion/rhinnorhea. Mouth/Throat: Mucous membranes are moist.  Oropharynx non-erythematous. Neck: No stridor.  No meningeal signs.   Cardiovascular: Normal rate, regular rhythm. Good peripheral circulation. Grossly normal heart sounds.   Respiratory: Normal respiratory effort.  No retractions. Lungs CTAB. Gastrointestinal: Soft and nontender. No distention.  Musculoskeletal: No lower extremity tenderness nor edema. No gross deformities of extremities. Neurologic:  Normal speech and language. No gross focal neurologic deficits are appreciated.  Skin:  Skin is warm, dry and intact. Open area of skin to right heel with surrounding warmth, tenderness, erythema and induration that goes up his instep a small amount. No fluctuance.  ____________________________________________   INITIAL IMPRESSION / ASSESSMENT AND PLAN / ED COURSE  Suspect this is a wart or other benign etiology that now has superficial infection 2/2 broken skin. abx started.      Pertinent labs & imaging results that were available during my care of the patient were reviewed by me and considered in my medical decision making (see chart  for details).  ____________________________________________  FINAL CLINICAL IMPRESSION(S) / ED DIAGNOSES  Final diagnoses:  Cellulitis, unspecified cellulitis site     MEDICATIONS GIVEN DURING THIS VISIT:  Medications  clindamycin (CLEOCIN) capsule 300 mg (300 mg Oral Given 09/21/18 0421)  ketorolac (TORADOL) injection 60 mg (60 mg Intramuscular Given 09/21/18 0421)     NEW OUTPATIENT MEDICATIONS STARTED DURING THIS VISIT:  Discharge Medication List as of 09/21/2018  4:14 AM    START taking these medications   Details  clindamycin (CLEOCIN) 300 MG capsule Take 1 capsule (300 mg total) by mouth 4  (four) times daily. X 7 days, Starting Sun 09/21/2018, Print        Note:  This note was prepared with assistance of Dragon voice recognition software. Occasional wrong-word or sound-a-like substitutions may have occurred due to the inherent limitations of voice recognition software.   Mitesh Rosendahl, Barbara CowerJason, MD 09/21/18 [phone]

## 2018-09-22 DIAGNOSIS — M79671 Pain in right foot: Secondary | ICD-10-CM | POA: Diagnosis not present

## 2018-11-10 DIAGNOSIS — K5901 Slow transit constipation: Secondary | ICD-10-CM | POA: Diagnosis not present

## 2018-11-10 DIAGNOSIS — Z760 Encounter for issue of repeat prescription: Secondary | ICD-10-CM | POA: Diagnosis not present

## 2020-08-30 ENCOUNTER — Ambulatory Visit: Payer: 59

## 2020-09-01 ENCOUNTER — Other Ambulatory Visit: Payer: Self-pay

## 2020-09-01 ENCOUNTER — Inpatient Hospital Stay: Payer: Self-pay | Attending: Medical

## 2020-09-01 DIAGNOSIS — Z23 Encounter for immunization: Secondary | ICD-10-CM

## 2020-09-01 NOTE — Progress Notes (Signed)
   Covid-19 Vaccination Clinic  Name:  William Lowe    MRN: [id number] DOB: 1996-06-12  09/01/2020  William Lowe was observed post Covid-19 immunization for 15 minutes without incident. He was provided with Vaccine Information Sheet and instruction to access the V-Safe system.   William Lowe was instructed to call 911 with any severe reactions post vaccine: Marland Kitchen Difficulty breathing  . Swelling of face and throat  . A fast heartbeat  . A bad rash all over body  . Dizziness and weakness   Immunizations Administered    Name Date Dose VIS Date Route   Pfizer COVID-19 Vaccine 09/01/2020  2:38 PM 0.3 mL 06/22/2020 Intramuscular   Manufacturer: [employer], [employer]   Lot: [id number]   NDC: [address]-9

## 2020-11-09 NOTE — Progress Notes (Signed)
[facility] [address] [place] [address] Phone: [phone] Subjective:   William Lowe, am serving as a scribe for Dr. Antoine Primas. This visit occurred during the SARS-CoV-2 public health emergency.  Safety protocols were in place, including screening questions prior to the visit, additional usage of staff PPE, and extensive cleaning of exam room while observing appropriate contact time as indicated for disinfecting solutions.   I'm seeing this patient by the request  of:  Pa, [facility]  CC: back pain   [id number]  William Lowe is a 25 y.o. male coming in with complaint of back pain for past few months. Patient states that he has both lower and middle back pain. Denies any radiating symptoms. Patient has tried IBU and lays down when he hurts. No weakness.  Patient is very active.  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  Patient is highly active.      No past medical history on file. No past surgical history on file. Social History   Socioeconomic History  . Marital status: Single    Spouse name: Not on file  . Number of children: Not on file  . Years of education: Not on file  . Highest education level: Not on file  Occupational History  . Not on file  Tobacco Use  . Smoking status: Never Smoker  . Smokeless tobacco: Never Used  Vaping Use  . Vaping Use: Never used  Substance and Sexual Activity  . Alcohol use: No  . Drug use: No  . Sexual activity: Not on file  Other Topics Concern  . Not on file  Social History Narrative  . Not on file   Social Determinants of Health   Financial Resource Strain: Not on file  Food Insecurity: Not on file  Transportation Needs: Not on file  Physical Activity: Not on file  Stress: Not on file  Social Connections: Not on file   No Known Allergies No family history on file.    Current Outpatient Medications (Respiratory):   .  HYDROcodone-homatropine (HYCODAN) 5-1.5 MG/5ML syrup, Take 5 mLs by mouth every 6 (six) hours as needed for cough.  Current Outpatient Medications (Analgesics):  .  ibuprofen (ADVIL,MOTRIN) 800 MG tablet, Take 1 tablet (800 mg total) by mouth 3 (three) times daily. .  meloxicam (MOBIC) 7.5 MG tablet, Take 1 tablet (7.5 mg total) by mouth daily.   Current Outpatient Medications (Other):  .  clindamycin (CLEOCIN) 300 MG capsule, Take 1 capsule (300 mg total) by mouth 4 (four) times daily. X 7 days .  nystatin (MYCOSTATIN) 100000 UNIT/ML suspension, Take 5 mLs (500,000 Units total) by mouth 4 (four) times daily. Marland Kitchen  tiZANidine (ZANAFLEX) 2 MG tablet, Take 1 tablet (2 mg total) by mouth at bedtime.   Reviewed prior external information including notes and imaging from  primary care provider As well as notes that were available from care everywhere and other healthcare systems.  Past medical history, social, surgical and family history all reviewed in electronic medical record.  No pertanent information unless stated regarding to the chief complaint.   Review of Systems:  No headache, visual changes, nausea, vomiting, diarrhea, constipation, dizziness, abdominal pain, skin rash, fevers, chills, night sweats, weight loss, swollen lymph nodes, body aches, joint swelling, chest pain, shortness of breath, mood changes. POSITIVE muscle aches  Objective  Blood pressure (!) 142/92, pulse 82, height 5\' 10"  (1.778 m), weight  185 lb (83.9 kg), SpO2 97 %.   General: No apparent distress alert and oriented x3 mood and affect normal, dressed appropriately.  HEENT: Pupils equal, extraocular movements intact  Respiratory: Patient's speak in full sentences and does not appear short of breath  Cardiovascular: No lower extremity edema, non tender, no erythema  Gait normal with good balance and coordination.  MSK: Patient is low back has some very mild tightness in the paraspinal musculature of the lumbar  spine.  Negative straight leg test.  Patient does have tightness in the thoracolumbar junction noted.  Mild tightness in the paraspinal musculature on the left side near the neck.  This is more in the trapezius.  Neck exam no has full range of motion otherwise.  5 out of 5 strength of all the extremities and deep tendon reflexes are intact    97110; 15 additional minutes spent for Therapeutic exercises as stated in above notes.  This included exercises focusing on stretching, strengthening, with significant focus on eccentric aspects.   Long term goals include an improvement in range of motion, strength, endurance as well as avoiding reinjury. Patient's frequency would include in 1-2 times a day, 3-5 times a week for a duration of 6-12 weeks. Low back exercises that included:  Pelvic tilt/bracing instruction to focus on control of the pelvic girdle and lower abdominal muscles  Glute strengthening exercises, focusing on proper firing of the glutes without engaging the low back muscles Proper stretching techniques for maximum relief for the hamstrings, hip flexors, low back and some rotation where tolerated    Proper technique shown and discussed handout in great detail with ATC.  All questions were discussed and answered.     Impression and Recommendations:     The above documentation has been reviewed and is accurate and complete Judi Saa, DO

## 2020-11-10 ENCOUNTER — Encounter: Payer: Self-pay | Admitting: Family Medicine

## 2020-11-10 ENCOUNTER — Other Ambulatory Visit: Payer: Self-pay | Admitting: Family Medicine

## 2020-11-10 ENCOUNTER — Ambulatory Visit (INDEPENDENT_AMBULATORY_CARE_PROVIDER_SITE_OTHER): Payer: 59

## 2020-11-10 ENCOUNTER — Ambulatory Visit: Payer: Self-pay | Admitting: Family Medicine

## 2020-11-10 ENCOUNTER — Ambulatory Visit: Payer: 59 | Admitting: Family Medicine

## 2020-11-10 ENCOUNTER — Other Ambulatory Visit: Payer: Self-pay

## 2020-11-10 VITALS — BP 142/92 | HR 82 | Ht 70.0 in | Wt 185.0 lb

## 2020-11-10 DIAGNOSIS — M546 Pain in thoracic spine: Secondary | ICD-10-CM

## 2020-11-10 DIAGNOSIS — M549 Dorsalgia, unspecified: Secondary | ICD-10-CM | POA: Diagnosis not present

## 2020-11-10 DIAGNOSIS — M545 Low back pain, unspecified: Secondary | ICD-10-CM

## 2020-11-10 DIAGNOSIS — M999 Biomechanical lesion, unspecified: Secondary | ICD-10-CM | POA: Insufficient documentation

## 2020-11-10 MED ORDER — TIZANIDINE HCL 2 MG PO TABS: 2.0000 mg | ORAL_TABLET | Freq: Every day | ORAL | 0 refills | Status: DC

## 2020-11-10 MED FILL — tiZANidine HCL 2 MG TABS: 2 | 30 days supply | Qty: 30 | Fill #0

## 2020-11-10 NOTE — Assessment & Plan Note (Signed)
Patient does have back pain that seems to be the entire axial skeleton intermittently.  We discussed with patient about this.  Patient is having more of some mild muscle imbalances it appears.  No signs of anything that would be more of a lumbar radiculopathy.  Patient is getting some response with the ibuprofen and can take it intermittently.  Given 3 mild muscle relaxer at night.  Patient denies any history of seizure disorder.  Patient work with Event organiser to learn home exercises in greater detail.  Follow-up with me again in 4 to 8 weeks worsening pain will consider the possibility of physical therapy.  X-rays are pending.

## 2020-11-10 NOTE — Patient Instructions (Addendum)
Hip flexor exercises Xray lumbar, thoracic Zanaflex 2mg  at night Vit D 2000IU daily Turmeric 500mg  daily Ice 20 min 2x a day See me again in 6 weeks

## 2020-11-18 ENCOUNTER — Ambulatory Visit: Payer: Self-pay | Admitting: Family Medicine

## 2020-12-02 ENCOUNTER — Encounter: Payer: Self-pay | Admitting: Family Medicine

## 2020-12-21 NOTE — Progress Notes (Deleted)
[facility] [address] [place] [address] Phone: [phone] Subjective:    I'm seeing this patient by the request  of:  Pa, [facility]  CC: Back and right hip pain  [id number]   11/10/2020 Patient does have back pain that seems to be the entire axial skeleton intermittently.  We discussed with patient about this.  Patient is having more of some mild muscle imbalances it appears.  No signs of anything that would be more of a lumbar radiculopathy.  Patient is getting some response with the ibuprofen and can take it intermittently.  Given 3 mild muscle relaxer at night.  Patient denies any history of seizure disorder.  Patient work with Event organiser to learn home exercises in greater detail.  Follow-up with me again in 4 to 8 weeks worsening pain will consider the possibility of physical therapy.  X-rays are pending.  Update 12/22/2020 William Lowe is a 25 y.o. male coming in with complaint of back and R hip pain. Patient states     Patient did have x-rays lumbar spine show the patient did have mild disc space loss at L5-S1 of the lumbar spine but otherwise fairly unremarkable.  Thoracic x-rays are unremarkable.  No past medical history on file. No past surgical history on file. Social History   Socioeconomic History  . Marital status: Single    Spouse name: Not on file  . Number of children: Not on file  . Years of education: Not on file  . Highest education level: Not on file  Occupational History  . Not on file  Tobacco Use  . Smoking status: Never Smoker  . Smokeless tobacco: Never Used  Vaping Use  . Vaping Use: Never used  Substance and Sexual Activity  . Alcohol use: No  . Drug use: No  . Sexual activity: Not on file  Other Topics Concern  . Not on file  Social History Narrative  . Not on file   Social Determinants of Health   Financial Resource Strain: Not on file  Food Insecurity: Not on file   Transportation Needs: Not on file  Physical Activity: Not on file  Stress: Not on file  Social Connections: Not on file   Not on File No family history on file.    Current Outpatient Medications (Respiratory):  .  HYDROcodone-homatropine (HYCODAN) 5-1.5 MG/5ML syrup, Take 5 mLs by mouth every 6 (six) hours as needed for cough.  Current Outpatient Medications (Analgesics):  .  ibuprofen (ADVIL,MOTRIN) 800 MG tablet, Take 1 tablet (800 mg total) by mouth 3 (three) times daily. .  meloxicam (MOBIC) 7.5 MG tablet, Take 1 tablet (7.5 mg total) by mouth daily.   Current Outpatient Medications (Other):  .  clindamycin (CLEOCIN) 300 MG capsule, Take 1 capsule (300 mg total) by mouth 4 (four) times daily. X 7 days .  nystatin (MYCOSTATIN) 100000 UNIT/ML suspension, Take 5 mLs (500,000 Units total) by mouth 4 (four) times daily. Marland Kitchen  tiZANidine (ZANAFLEX) 2 MG tablet, TAKE 1 TABLET BY MOUTH AT BEDTIME   Reviewed prior external information including notes and imaging from  primary care provider As well as notes that were available from care everywhere and other healthcare systems.  Past medical history, social, surgical and family history all reviewed in electronic medical record.  No pertanent information unless stated regarding to the chief complaint.   Review of Systems:  No headache, visual changes, nausea, vomiting, diarrhea, constipation, dizziness, abdominal pain,  skin rash, fevers, chills, night sweats, weight loss, swollen lymph nodes, body aches, joint swelling, chest pain, shortness of breath, mood changes. POSITIVE muscle aches  Objective  There were no vitals taken for this visit.   General: No apparent distress alert and oriented x3 mood and affect normal, dressed appropriately.  HEENT: Pupils equal, extraocular movements intact  Respiratory: Patient's speak in full sentences and does not appear short of breath  Cardiovascular: No lower extremity edema, non tender, no  erythema  Gait normal with good balance and coordination.  MSK:     Impression and Recommendations:     The above documentation has been reviewed and is accurate and complete Judi Saa, DO

## 2020-12-22 ENCOUNTER — Ambulatory Visit: Payer: 59 | Admitting: Family Medicine

## 2020-12-29 ENCOUNTER — Ambulatory Visit: Payer: 59 | Admitting: Family Medicine

## 2022-02-09 ENCOUNTER — Encounter (HOSPITAL_COMMUNITY): Payer: Self-pay

## 2022-02-09 ENCOUNTER — Emergency Department (HOSPITAL_COMMUNITY)
Admission: EM | Admit: 2022-02-09 | Discharge: 2022-02-09 | Disposition: A | Discharge status: Home / Self Care | Payer: BC Managed Care – PPO | Attending: Emergency Medicine | Admitting: Emergency Medicine

## 2022-02-09 ENCOUNTER — Other Ambulatory Visit (HOSPITAL_COMMUNITY): Payer: Self-pay

## 2022-02-09 DIAGNOSIS — S4991XA Unspecified injury of right shoulder and upper arm, initial encounter: Secondary | ICD-10-CM | POA: Diagnosis not present

## 2022-02-09 DIAGNOSIS — Y990 Civilian activity done for income or pay: Secondary | ICD-10-CM | POA: Diagnosis not present

## 2022-02-09 DIAGNOSIS — X58XXXA Exposure to other specified factors, initial encounter: Secondary | ICD-10-CM | POA: Insufficient documentation

## 2022-02-09 DIAGNOSIS — S46911A Strain of unspecified muscle, fascia and tendon at shoulder and upper arm level, right arm, initial encounter: Secondary | ICD-10-CM | POA: Diagnosis not present

## 2022-02-09 HISTORY — DX: Sickle-cell trait: D57.3

## 2022-02-09 MED ORDER — CYCLOBENZAPRINE HCL 10 MG PO TABS
10.0000 mg | ORAL_TABLET | Freq: Two times a day (BID) | ORAL | 0 refills | Status: DC | PRN
  Filled 2022-02-09: qty 20, 10d supply, fill #0

## 2022-02-09 MED ORDER — IBUPROFEN 600 MG PO TABS
600.0000 mg | ORAL_TABLET | Freq: Four times a day (QID) | ORAL | 0 refills | Status: DC | PRN
  Filled 2022-02-09: qty 30, 8d supply, fill #0

## 2022-02-09 NOTE — ED Triage Notes (Signed)
Pt c/o neck and shoulder pain that began yesterday while working on his car. Pt denies trauma.

## 2022-02-09 NOTE — ED Provider Notes (Signed)
[facility]-EMERGENCY DEPT Provider Note   CSN: [id number] Arrival date & time: 02/09/22  1507     History  Chief Complaint  Patient presents with   Shoulder Pain    William Lowe is a 26 y.o. male.  The history is provided by the patient and medical records. No language interpreter was used.  Shoulder Pain    William Lowe is a 26yo M here for concern of right shoulder pain. It started yesterday around 8pm while he was working on his car. Denies heavy lifting, trauma to the area, or unusual repetitive movement. The pain occurs with movement, and radiates to his upper bicep and into the right side of his neck. He used ice on the area and took 800mg  of ibuprofen last night, which helped with his pain. He denies numbness or tingling in his hand and fingers.  Home Medications Prior to Admission medications   Medication Sig Start Date End Date Taking? Authorizing Provider  clindamycin (CLEOCIN) 300 MG capsule Take 1 capsule (300 mg total) by mouth 4 (four) times daily. X 7 days 09/21/18   Mesner, 09/23/18, MD  HYDROcodone-homatropine [facility]) 5-1.5 MG/5ML syrup Take 5 mLs by mouth every 6 (six) hours as needed for cough. 08/14/18   14/12/19, MD  ibuprofen (ADVIL,MOTRIN) 800 MG tablet Take 1 tablet (800 mg total) by mouth 3 (three) times daily. 09/21/18   Mesner, 09/23/18, MD  meloxicam (MOBIC) 7.5 MG tablet Take 1 tablet (7.5 mg total) by mouth daily. 10/22/17   10/24/17, PA-C  nystatin (MYCOSTATIN) 100000 UNIT/ML suspension Take 5 mLs (500,000 Units total) by mouth 4 (four) times daily. 08/17/18   08/19/18, NP      Allergies    Patient has no known allergies.    Review of Systems   Review of Systems  All other systems reviewed and are negative.   Physical Exam Updated Vital Signs BP (!) 159/98 (BP Location: Left Arm)   Pulse 97   Temp 98.3 F (36.8 C) (Oral)   Resp 18   Ht 5\' 11"  (1.803 m)   Wt 90.7 kg   SpO2 99%   BMI 27.89 kg/m  Physical  Exam Vitals and nursing note reviewed.  Constitutional:      General: He is not in acute distress.    Appearance: He is well-developed.  HENT:     Head: Atraumatic.  Eyes:     Conjunctiva/sclera: Conjunctivae normal.  Musculoskeletal:        General: Tenderness (Right shoulder: Mild tenderness along the right trapezius muscle on palpation.  No significant midline spine tenderness.  Shoulder with full range of motion without any deformity.  No overlying skin changes.) present.     Cervical back: Neck supple.  Skin:    Findings: No rash.  Neurological:     Mental Status: He is alert.     ED Results / Procedures / Treatments   Labs (all labs ordered are listed, but only abnormal results are displayed) Labs Reviewed - No data to display  EKG None  Radiology No results found.  Procedures Procedures    Medications Ordered in ED Medications - No data to display  ED Course/ Medical Decision Making/ A&P                           Medical Decision Making  BP (!) 159/98 (BP Location: Left Arm)   Pulse 97   Temp 98.3 F (  36.8 C) (Oral)   Resp 18   Ht 5\' 11"  (1.803 m)   Wt 90.7 kg   SpO2 99%   BMI 27.89 kg/m   3:40 PM This is a 26 year old male presenting for evaluation of right shoulder pain.  Patient report he was working on his car yesterday and subsequently noticed pain to his right upper shoulder.  He described pain as a sharp stabbing sensation worse with movement that happens sporadically and resolves with rest.  No report of any significant headache, neck pain, chest pain, trouble breathing, abdominal pain, focal numbness or focal weakness.  He denies any direct trauma to his shoulder.  Pain did improve with taking ibuprofen but not fully resolved.  Today he still noticed lingering pain prompting this ER visit.  He has had similar pain like this in the past.  On exam he does have some mild tenderness about the right trapezius muscle without any overlying skin  changes.  Neck with full range of motion, right shoulder with full range of motion, normal grip strength bilaterally.  Suspect this is MSK pain.  Low suspicion for rotator cuff injury, doubt cardiopulmonary disease, doubt cervical radiculopathy.  No evidence to suggest cellulitis or septic joint.  Will discharge home with symptomatic treatment which include anti-inflammatory occasion muscle relaxants.  I have considered x-ray of the right shoulder but due to lack of trauma I felt that it is low yield.        Final Clinical Impression(s) / ED Diagnoses Final diagnoses:  Strain of right shoulder, initial encounter    Rx / DC Orders ED Discharge Orders          Ordered    ibuprofen (ADVIL) 600 MG tablet  Every 6 hours PRN        02/09/22 1544    cyclobenzaprine (FLEXERIL) 10 MG tablet  2 times daily PRN        02/09/22 1544              04/11/22, PA-C 02/09/22 1546    Tegeler, 04/11/22, MD 02/09/22 1659

## 2022-06-11 ENCOUNTER — Ambulatory Visit
Admission: EM | Admit: 2022-06-11 | Discharge: 2022-06-11 | Disposition: A | Discharge status: Home / Self Care | Payer: BC Managed Care – PPO | Attending: Physician Assistant | Admitting: Physician Assistant

## 2022-06-11 DIAGNOSIS — R0789 Other chest pain: Secondary | ICD-10-CM

## 2022-06-11 MED ORDER — CYCLOBENZAPRINE HCL 10 MG PO TABS: 10.0000 mg | ORAL_TABLET | Freq: Two times a day (BID) | ORAL | 0 refills | Status: DC | PRN

## 2022-06-11 MED ORDER — PREDNISONE 20 MG PO TABS: 40.0000 mg | ORAL_TABLET | Freq: Every day | ORAL | 0 refills | Status: AC

## 2022-06-11 NOTE — ED Triage Notes (Signed)
Pt c/o when he coughs or laughs or turns to the left he feels a sharp pain between the left bottom rib and abd area. Denies trauma/injury. Onset ~ 3 days ago denies pain in triage.

## 2022-06-11 NOTE — ED Provider Notes (Signed)
[facility]    CSN: [id number] Arrival date & time: 06/11/22  1724      History   Chief Complaint Chief Complaint  Patient presents with   Pain    HPI William Lowe is a 26 y.o. male.   Patient here today for evaluation of pain in his left lower rib area that seems to be worsened with coughing and laughing as well as rotation.  He denies any shortness of breath.  He has not had any known injury but does state that he was sick for a while prior to this occurring.  He has not had any blood in his stool or dark tarry stools.  He denies any blood in his urine.  He has not taken any medication for symptoms.  The history is provided by the patient.    Past Medical History:  Diagnosis Date   Sickle cell trait [facility])     Patient Active Problem List   Diagnosis Date Noted   Back pain 11/10/2020    History reviewed. No pertinent surgical history.     Home Medications    Prior to Admission medications   Medication Sig Start Date End Date Taking? Authorizing Provider  cyclobenzaprine (FLEXERIL) 10 MG tablet Take 1 tablet (10 mg total) by mouth 2 (two) times daily as needed for muscle spasms. 06/11/22  Yes Francene Finders, PA-C  predniSONE (DELTASONE) 20 MG tablet Take 2 tablets (40 mg total) by mouth daily with breakfast for 5 days. 06/11/22 06/16/22 Yes Francene Finders, PA-C  clindamycin (CLEOCIN) 300 MG capsule Take 1 capsule (300 mg total) by mouth 4 (four) times daily. X 7 days 09/21/18   Mesner, Corene Cornea, MD  HYDROcodone-homatropine [facility]) 5-1.5 MG/5ML syrup Take 5 mLs by mouth every 6 (six) hours as needed for cough. 08/14/18   Vanessa Kick, MD  ibuprofen (ADVIL) 600 MG tablet Take 1 tablet (600 mg total) by mouth every 6 (six) hours as needed. 02/09/22   Domenic Moras, PA-C  meloxicam (MOBIC) 7.5 MG tablet Take 1 tablet (7.5 mg total) by mouth daily. 10/22/17   Jaynee Eagles, PA-C  nystatin (MYCOSTATIN) 100000 UNIT/ML suspension Take 5 mLs (500,000 Units total) by  mouth 4 (four) times daily. 08/17/18   Ashley Murrain, NP    Family History History reviewed. No pertinent family history.  Social History Social History   Tobacco Use   Smoking status: Never   Smokeless tobacco: Never  Vaping Use   Vaping Use: Never used  Substance Use Topics   Alcohol use: No   Drug use: No     Allergies   Patient has no known allergies.   Review of Systems Review of Systems  Constitutional:  Negative for chills and fever.  Eyes:  Negative for discharge and redness.  Respiratory:  Negative for cough and shortness of breath.   Cardiovascular:  Positive for chest pain.  Gastrointestinal:  Negative for blood in stool.  Genitourinary:  Negative for hematuria.  Neurological:  Negative for numbness.     Physical Exam Triage Vital Signs ED Triage Vitals [06/11/22 1843]  Enc Vitals Group     BP 138/87     Pulse Rate 73     Resp 16     Temp 98.5 F (36.9 C)     Temp Source Oral     SpO2 98 %     Weight      Height      Head Circumference  Peak Flow      Pain Score 0     Pain Loc      Pain Edu?      Excl. in GC?    No data found.  Updated Vital Signs BP 138/87 (BP Location: Left Arm)   Pulse 73   Temp 98.5 F (36.9 C) (Oral)   Resp 16   SpO2 98%      Physical Exam Vitals and nursing note reviewed.  Constitutional:      General: He is not in acute distress.    Appearance: Normal appearance. He is not ill-appearing.  HENT:     Head: Normocephalic and atraumatic.  Eyes:     Conjunctiva/sclera: Conjunctivae normal.  Cardiovascular:     Rate and Rhythm: Normal rate.  Pulmonary:     Effort: Pulmonary effort is normal. No respiratory distress.     Breath sounds: Normal breath sounds.  Chest:     Chest wall: Tenderness (mild TTP to lower anterior ribs) present.  Neurological:     Mental Status: He is alert.  Psychiatric:        Mood and Affect: Mood normal.        Behavior: Behavior normal.        Thought Content: Thought  content normal.      UC Treatments / Results  Labs (all labs ordered are listed, but only abnormal results are displayed) Labs Reviewed - No data to display  EKG   Radiology No results found.  Procedures Procedures (including critical care time)  Medications Ordered in UC Medications - No data to display  Initial Impression / Assessment and Plan / UC Course  I have reviewed the triage vital signs and the nursing notes.  Pertinent labs & imaging results that were available during my care of the patient were reviewed by me and considered in my medical decision making (see chart for details).    Discussed most likely muscular strain from recent illness/coughing.  Will treat with steroid burst as well as muscle relaxer and encouraged follow-up if no gradual improvement or with any further concerns.  Final Clinical Impressions(s) / UC Diagnoses   Final diagnoses:  Left-sided chest wall pain   Discharge Instructions   None    ED Prescriptions     Medication Sig Dispense Auth. Provider   predniSONE (DELTASONE) 20 MG tablet Take 2 tablets (40 mg total) by mouth daily with breakfast for 5 days. 10 tablet Erma Pinto F, PA-C   cyclobenzaprine (FLEXERIL) 10 MG tablet Take 1 tablet (10 mg total) by mouth 2 (two) times daily as needed for muscle spasms. 20 tablet Tomi Bamberger, PA-C      PDMP not reviewed this encounter.   Tomi Bamberger, PA-C 06/11/22 1916

## 2023-09-17 ENCOUNTER — Other Ambulatory Visit: Payer: Self-pay

## 2023-09-17 ENCOUNTER — Encounter: Payer: Self-pay | Admitting: Emergency Medicine

## 2023-09-17 ENCOUNTER — Other Ambulatory Visit (HOSPITAL_COMMUNITY): Payer: Self-pay

## 2023-09-17 ENCOUNTER — Ambulatory Visit
Admission: EM | Admit: 2023-09-17 | Discharge: 2023-09-17 | Disposition: A | Discharge status: Home / Self Care | Payer: BC Managed Care – PPO | Attending: Emergency Medicine | Admitting: Emergency Medicine

## 2023-09-17 DIAGNOSIS — J22 Unspecified acute lower respiratory infection: Secondary | ICD-10-CM | POA: Diagnosis not present

## 2023-09-17 DIAGNOSIS — R051 Acute cough: Secondary | ICD-10-CM | POA: Diagnosis not present

## 2023-09-17 DIAGNOSIS — R030 Elevated blood-pressure reading, without diagnosis of hypertension: Secondary | ICD-10-CM | POA: Diagnosis not present

## 2023-09-17 MED ORDER — DOXYCYCLINE HYCLATE 100 MG PO CAPS
100.0000 mg | ORAL_CAPSULE | Freq: Two times a day (BID) | ORAL | 0 refills | Status: AC
  Filled 2023-09-17: qty 20, 10d supply, fill #0

## 2023-09-17 MED ORDER — PROMETHAZINE-DM 6.25-15 MG/5ML PO SYRP
5.0000 mL | ORAL_SOLUTION | Freq: Four times a day (QID) | ORAL | 0 refills | Status: AC | PRN
  Filled 2023-09-17: qty 118, 6d supply, fill #0

## 2023-09-17 NOTE — ED Triage Notes (Signed)
 Pt here for cough and congestion x 1 week; pt sts initially improved but now feeling worse again; pt sts pain with cough

## 2023-09-17 NOTE — ED Provider Notes (Signed)
 [facility]    CSN: [id number] Arrival date & time: 09/17/23  1425      History   Chief Complaint Chief Complaint  Patient presents with   Cough    HPI William Lowe is a 28 y.o. male.   28 year old male, William Lowe, presents to urgent care for evaluation of cough and congestion for 1 week.  Patient reports pain with cough.  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  Patient states he has been taken over-the-counter meds without relief.  The history is provided by the patient. No language interpreter was used.    Past Medical History:  Diagnosis Date   Sickle cell trait [facility])     Patient Active Problem List   Diagnosis Date Noted   Acute respiratory infection 09/17/2023   Acute cough 09/17/2023   Elevated blood pressure reading 09/17/2023   Back pain 11/10/2020    History reviewed. No pertinent surgical history.     Home Medications    Prior to Admission medications   Medication Sig Start Date End Date Taking? Authorizing Provider  doxycycline  (VIBRAMYCIN ) 100 MG capsule Take 1 capsule (100 mg total) by mouth 2 (two) times daily. 09/17/23  Yes Beautiful Pensyl, NP  promethazine -dextromethorphan (PROMETHAZINE -DM) 6.25-15 MG/5ML syrup Take 5 mLs by mouth 4 (four) times daily as needed for cough. 09/17/23  Yes Aleksandar Duve, Rilla, NP  clindamycin  (CLEOCIN ) 300 MG capsule Take 1 capsule (300 mg total) by mouth 4 (four) times daily. X 7 days Patient not taking: Reported on 09/17/2023 09/21/18   Mesner, Selinda, MD  cyclobenzaprine  (FLEXERIL ) 10 MG tablet Take 1 tablet (10 mg total) by mouth 2 (two) times daily as needed for muscle spasms. 06/11/22   Billy Asberry FALCON, PA-C  HYDROcodone -homatropine (HYCODAN) 5-1.5 MG/5ML syrup Take 5 mLs by mouth every 6 (six) hours as needed for cough. Patient not taking: Reported on 09/17/2023 08/14/18   Rolinda Rogue, MD  ibuprofen  (ADVIL ) 600 MG tablet Take 1 tablet  (600 mg total) by mouth every 6 (six) hours as needed. 02/09/22   Nivia Colon, PA-C  meloxicam  (MOBIC ) 7.5 MG tablet Take 1 tablet (7.5 mg total) by mouth daily. 10/22/17   Christopher Savannah, PA-C  nystatin  (MYCOSTATIN ) 100000 UNIT/ML suspension Take 5 mLs (500,000 Units total) by mouth 4 (four) times daily. 08/17/18   Jamelle Lorrayne CHRISTELLA, NP    Family History History reviewed. No pertinent family history.  Social History Social History   Tobacco Use   Smoking status: Never   Smokeless tobacco: Never  Vaping Use   Vaping status: Never Used  Substance Use Topics   Alcohol use: No   Drug use: No     Allergies   Patient has no known allergies.   Review of Systems Review of Systems  Constitutional:  Negative for fever.  HENT:  Positive for congestion.   Respiratory:  Positive for cough.   All other systems reviewed and are negative.    Physical Exam Triage Vital Signs ED Triage Vitals [09/17/23 1534]  Encounter Vitals Group     BP (!) 165/110     Systolic BP Percentile      Diastolic BP Percentile      Pulse Rate 92     Resp 18     Temp 99.4 F (37.4 C)     Temp Source Oral     SpO2 99 %     Weight      Height  Head Circumference      Peak Flow      Pain Score 2     Pain Loc      Pain Education      Exclude from Growth Chart    No data found.  Updated Vital Signs BP (!) 165/110 (BP Location: Left Arm)   Pulse 92   Temp 99.4 F (37.4 C) (Oral)   Resp 18   SpO2 99%   Visual Acuity Right Eye Distance:   Left Eye Distance:   Bilateral Distance:    Right Eye Near:   Left Eye Near:    Bilateral Near:     Physical Exam Vitals and nursing note reviewed.  Constitutional:      General: He is not in acute distress.    Appearance: He is well-developed and well-groomed. He is not ill-appearing or toxic-appearing.  HENT:     Head: Normocephalic.     Right Ear: Tympanic membrane is retracted.     Left Ear: Tympanic membrane is retracted.     Nose: Mucosal edema  present.     Mouth/Throat:     Pharynx: Uvula midline.  Eyes:     General: Lids are normal.     Conjunctiva/sclera: Conjunctivae normal.     Pupils: Pupils are equal, round, and reactive to light.  Cardiovascular:     Rate and Rhythm: Normal rate and regular rhythm.     Pulses: Normal pulses.     Heart sounds: Normal heart sounds.  Pulmonary:     Effort: Pulmonary effort is normal. No respiratory distress.     Breath sounds: Normal breath sounds and air entry. No decreased breath sounds or wheezing.  Abdominal:     General: There is no distension.     Palpations: Abdomen is soft.  Musculoskeletal:        General: Normal range of motion.     Cervical back: Normal range of motion.  Skin:    General: Skin is warm and dry.     Findings: No rash.  Neurological:     General: No focal deficit present.     Mental Status: He is alert and oriented to person, place, and time.     GCS: GCS eye subscore is 4. GCS verbal subscore is 5. GCS motor subscore is 6.     Cranial Nerves: No cranial nerve deficit.     Sensory: No sensory deficit.  Psychiatric:        Attention and Perception: Attention normal.        Mood and Affect: Mood normal.        Speech: Speech normal.        Behavior: Behavior normal. Behavior is cooperative.      UC Treatments / Results  Labs (all labs ordered are listed, but only abnormal results are displayed) Labs Reviewed - No data to display  EKG   Radiology No results found.  Procedures Procedures (including critical care time)  Medications Ordered in UC Medications - No data to display  Initial Impression / Assessment and Plan / UC Course  I have reviewed the triage vital signs and the nursing notes.  Pertinent labs & imaging results that were available during my care of the patient were reviewed by me and considered in my medical decision making (see chart for details).    Discussed exam findings and plan of care with patient, strict go to ER  precautions given.   Patient verbalized understanding to this provider.  Ddx:  Acute respiratory infection, acute cough, elevated blood pressure  Final Clinical Impressions(s) / UC Diagnoses   Final diagnoses:  Acute respiratory infection  Acute cough  Elevated blood pressure reading     Discharge Instructions      Please get your blood pressure checked as it is elevated at today's visit.  Take antibiotic/cough med as directed, rest, push fluids.   ED Prescriptions     Medication Sig Dispense Auth. Provider   doxycycline  (VIBRAMYCIN ) 100 MG capsule Take 1 capsule (100 mg total) by mouth 2 (two) times daily. 20 capsule Librado Guandique, NP   promethazine -dextromethorphan (PROMETHAZINE -DM) 6.25-15 MG/5ML syrup Take 5 mLs by mouth 4 (four) times daily as needed for cough. 118 mL Pablo Stauffer, NP      PDMP not reviewed this encounter.   Aminta Loose, NP 09/17/23 2054

## 2023-09-17 NOTE — Discharge Instructions (Addendum)
 Please get your blood pressure checked as it is elevated at today's visit.  Take antibiotic/cough med as directed, rest, push fluids.

## 2023-09-30 ENCOUNTER — Encounter: Payer: Self-pay | Admitting: Family Medicine

## 2023-09-30 ENCOUNTER — Other Ambulatory Visit (HOSPITAL_COMMUNITY): Payer: Self-pay

## 2023-09-30 ENCOUNTER — Ambulatory Visit: Payer: BC Managed Care – PPO | Admitting: Family Medicine

## 2023-09-30 VITALS — BP 137/82 | HR 89 | Temp 98.1°F | Resp 16 | Ht 71.0 in | Wt 209.9 lb

## 2023-09-30 DIAGNOSIS — M545 Low back pain, unspecified: Secondary | ICD-10-CM | POA: Diagnosis not present

## 2023-09-30 DIAGNOSIS — Z7689 Persons encountering health services in other specified circumstances: Secondary | ICD-10-CM | POA: Diagnosis not present

## 2023-09-30 MED ORDER — MELOXICAM 7.5 MG PO TABS
7.5000 mg | ORAL_TABLET | Freq: Every day | ORAL | 0 refills | Status: DC
  Filled 2023-09-30: qty 30, 30d supply, fill #0

## 2023-09-30 MED ORDER — CYCLOBENZAPRINE HCL 10 MG PO TABS
10.0000 mg | ORAL_TABLET | Freq: Two times a day (BID) | ORAL | 0 refills | Status: DC | PRN
  Filled 2023-09-30: qty 20, 10d supply, fill #0

## 2023-09-30 NOTE — Progress Notes (Unsigned)
Patient is here from visit at [facility]. Patient had a cough  and is now feeling better. 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

## 2023-10-01 ENCOUNTER — Encounter: Payer: Self-pay | Admitting: Family Medicine

## 2023-10-01 NOTE — Progress Notes (Signed)
New Patient Office Visit  Subjective    Patient ID: William Lowe, male    DOB: 03/23/1996  Age: 28 y.o. MRN: [id number]  CC:  Chief Complaint  Patient presents with   Medical Management of Chronic Issues    HPI William Lowe presents to establish care and for follow up of recent visit to UC. Patient was given abx and cough med for respiratory infection and he reports that he feels better with symptoms resolved. He complains today of intermittent back pain. 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 He denies known injury or trauma.   Outpatient Encounter Medications as of 09/30/2023  Medication Sig   clindamycin (CLEOCIN) 300 MG capsule Take 1 capsule (300 mg total) by mouth 4 (four) times daily. X 7 days (Patient not taking: Reported on 09/17/2023)   cyclobenzaprine (FLEXERIL) 10 MG tablet Take 1 tablet (10 mg total) by mouth 2 (two) times daily as needed for muscle spasms.   doxycycline (VIBRAMYCIN) 100 MG capsule Take 1 capsule (100 mg total) by mouth 2 (two) times daily.   HYDROcodone-homatropine (HYCODAN) 5-1.5 MG/5ML syrup Take 5 mLs by mouth every 6 (six) hours as needed for cough. (Patient not taking: Reported on 09/17/2023)   ibuprofen (ADVIL) 600 MG tablet Take 1 tablet (600 mg total) by mouth every 6 (six) hours as needed.   meloxicam (MOBIC) 7.5 MG tablet Take 1 tablet (7.5 mg total) by mouth daily.   nystatin (MYCOSTATIN) 100000 UNIT/ML suspension Take 5 mLs (500,000 Units total) by mouth 4 (four) times daily.   promethazine-dextromethorphan (PROMETHAZINE-DM) 6.25-15 MG/5ML syrup Take 5 mLs by mouth 4 (four) times daily as needed for cough.   [DISCONTINUED] cyclobenzaprine (FLEXERIL) 10 MG tablet Take 1 tablet (10 mg total) by mouth 2 (two) times daily as needed for muscle spasms.   [DISCONTINUED] meloxicam (MOBIC) 7.5 MG tablet Take 1 tablet (7.5 mg total) by mouth daily.   No facility-administered encounter  medications on file as of 09/30/2023.    Past Medical History:  Diagnosis Date   Sickle cell trait (HCC)     History reviewed. No pertinent surgical history.  History reviewed. No pertinent family history.  Social History   Socioeconomic History   Marital status: Single    Spouse name: Not on file   Number of children: Not on file   Years of education: Not on file   Highest education level: Not on file  Occupational History   Not on file  Tobacco Use   Smoking status: Never   Smokeless tobacco: Never  Vaping Use   Vaping status: Never Used  Substance and Sexual Activity   Alcohol use: No   Drug use: No   Sexual activity: Not on file  Other Topics Concern   Not on file  Social History Narrative   Not on file   Social Drivers of Health   Financial Resource Strain: Not on file  Food Insecurity: Not on file  Transportation Needs: Not on file  Physical Activity: Not on file  Stress: Not on file  Social Connections: Not on file  Intimate Partner Violence: Not on file    Review of Systems  Musculoskeletal:  Positive for back pain.  All other systems reviewed and are negative.       Objective   BP 137/82   Pulse 89   Temp 98.1 F (36.7 C) (Oral)   Resp 16  Ht 5\' 11"  (1.803 m)   Wt 209 lb 14.4 oz (95.2 kg)   SpO2 98%   BMI 29.28 kg/m   Physical Exam Vitals and nursing note reviewed.  Constitutional:      General: He is not in acute distress. Cardiovascular:     Rate and Rhythm: Normal rate and regular rhythm.  Pulmonary:     Effort: Pulmonary effort is normal.     Breath sounds: Normal breath sounds.  Abdominal:     Palpations: Abdomen is soft.     Tenderness: There is no abdominal tenderness.  Musculoskeletal:        General: Normal range of motion.  Neurological:     General: No focal deficit present.     Mental Status: He is alert and oriented to person, place, and time.         Assessment & Plan:    1. Bilateral low back pain  without sciatica, unspecified chronicity (Primary) Exercises given. Meloxicam and flexeril preescribed.   2. Encounter to establish care    Return if symptoms worsen or fail to improve.   Tommie Raymond, MD

## 2023-11-29 ENCOUNTER — Other Ambulatory Visit: Payer: Self-pay

## 2023-11-29 ENCOUNTER — Emergency Department (HOSPITAL_COMMUNITY)

## 2023-11-29 ENCOUNTER — Encounter (HOSPITAL_COMMUNITY): Payer: Self-pay

## 2023-11-29 ENCOUNTER — Emergency Department (HOSPITAL_COMMUNITY)
Admission: EM | Admit: 2023-11-29 | Discharge: 2023-11-29 | Disposition: A | Discharge status: Home / Self Care | Attending: Emergency Medicine | Admitting: Emergency Medicine

## 2023-11-29 ENCOUNTER — Ambulatory Visit (INDEPENDENT_AMBULATORY_CARE_PROVIDER_SITE_OTHER): Admitting: Family Medicine

## 2023-11-29 VITALS — HR 77 | Temp 98.1°F | Resp 16 | Wt 204.0 lb

## 2023-11-29 DIAGNOSIS — R109 Unspecified abdominal pain: Secondary | ICD-10-CM

## 2023-11-29 LAB — URINALYSIS, ROUTINE W REFLEX MICROSCOPIC
Bilirubin Urine: NEGATIVE
Glucose, UA: NEGATIVE mg/dL
Hgb urine dipstick: NEGATIVE
Ketones, ur: NEGATIVE mg/dL
Leukocytes,Ua: NEGATIVE
Nitrite: NEGATIVE
Protein, ur: NEGATIVE mg/dL
Specific Gravity, Urine: 1.017 (ref 1.005–1.030)
pH: 6 (ref 5.0–8.0)

## 2023-11-29 LAB — CBC WITH DIFFERENTIAL/PLATELET
Abs Immature Granulocytes: 0.03 10*3/uL (ref 0.00–0.07)
Basophils Absolute: 0 10*3/uL (ref 0.0–0.1)
Basophils Relative: 0 %
Eosinophils Absolute: 0 10*3/uL (ref 0.0–0.5)
Eosinophils Relative: 0 %
HCT: 48.1 % (ref 39.0–52.0)
Hemoglobin: 15.6 g/dL (ref 13.0–17.0)
Immature Granulocytes: 0 %
Lymphocytes Relative: 19 %
Lymphs Abs: 2.1 10*3/uL (ref 0.7–4.0)
MCH: 27 pg (ref 26.0–34.0)
MCHC: 32.4 g/dL (ref 30.0–36.0)
MCV: 83.4 fL (ref 80.0–100.0)
Monocytes Absolute: 0.6 10*3/uL (ref 0.1–1.0)
Monocytes Relative: 6 %
Neutro Abs: 8 10*3/uL — ABNORMAL HIGH (ref 1.7–7.7)
Neutrophils Relative %: 75 %
Platelets: 288 10*3/uL (ref 150–400)
RBC: 5.77 MIL/uL (ref 4.22–5.81)
RDW: 13 % (ref 11.5–15.5)
WBC: 10.8 10*3/uL — ABNORMAL HIGH (ref 4.0–10.5)
nRBC: 0 % (ref 0.0–0.2)

## 2023-11-29 LAB — COMPREHENSIVE METABOLIC PANEL WITH GFR
ALT: 26 U/L (ref 0–44)
AST: 24 U/L (ref 15–41)
Albumin: 4.4 g/dL (ref 3.5–5.0)
Alkaline Phosphatase: 58 U/L (ref 38–126)
Anion gap: 11 (ref 5–15)
BUN: 18 mg/dL (ref 6–20)
CO2: 25 mmol/L (ref 22–32)
Calcium: 9.4 mg/dL (ref 8.9–10.3)
Chloride: 102 mmol/L (ref 98–111)
Creatinine, Ser: 1.27 mg/dL — ABNORMAL HIGH (ref 0.61–1.24)
GFR, Estimated: >60 mL/min (ref >60)
Glucose, Bld: 117 mg/dL — ABNORMAL HIGH (ref 70–99)
Potassium: 3.8 mmol/L (ref 3.5–5.1)
Sodium: 138 mmol/L (ref 135–145)
Total Bilirubin: 0.7 mg/dL (ref 0.0–1.2)
Total Protein: 7.8 g/dL (ref 6.5–8.1)

## 2023-11-29 LAB — LIPASE, BLOOD: Lipase: 26 U/L (ref 11–51)

## 2023-11-29 MED ORDER — MORPHINE SULFATE (PF) 4 MG/ML IV SOLN
4.0000 mg | Freq: Once | INTRAVENOUS | Status: AC
  Administered 2023-11-29: 4 mg via INTRAVENOUS
  Filled 2023-11-29: qty 1

## 2023-11-29 MED ORDER — SODIUM CHLORIDE 0.9 % IV BOLUS
1000.0000 mL | Freq: Once | INTRAVENOUS | Status: AC
  Administered 2023-11-29: 1000 mL via INTRAVENOUS

## 2023-11-29 MED ORDER — ONDANSETRON HCL 4 MG/2ML IJ SOLN
4.0000 mg | Freq: Once | INTRAMUSCULAR | Status: AC
  Administered 2023-11-29: 4 mg via INTRAVENOUS
  Filled 2023-11-29: qty 2

## 2023-11-29 MED ORDER — KETOROLAC TROMETHAMINE 15 MG/ML IJ SOLN
15.0000 mg | Freq: Once | INTRAMUSCULAR | Status: AC
  Administered 2023-11-29: 15 mg via INTRAVENOUS
  Filled 2023-11-29: qty 1

## 2023-11-29 NOTE — ED Triage Notes (Signed)
 Patient presented to ER with left flank pain, thinks he is passing a kidney stone. 10/10 flank pain

## 2023-11-29 NOTE — ED Notes (Signed)
 Pt able to ambulate to restroom. Pt given specimen cup and strainer.

## 2023-11-29 NOTE — Discharge Instructions (Signed)
 Evaluation of your left flank pain was overall reassuring.  Suspect this could be a muscle injury.  Recommend that you treat conservatively at home.  This includes gentle stretching, applying ice 3-4 times a day.  You can also take ibuprofen.  Also please follow-up with your PCP.

## 2023-11-29 NOTE — ED Provider Notes (Signed)
 [place] EMERGENCY DEPARTMENT AT [facility] Provider Note   CSN: [id number] Arrival date & time: 11/29/23  1146     History  Chief Complaint  Patient presents with   Flank Pain   HPI William Lowe is a 28 y.o. male presenting for left flank pain.  Started hour ago.  States that started while he was driving.  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  Pain is in the left flank and radiates to the left mid abdomen. Denies urinary symptoms.  States he does feel nauseous but denies diarrhea or vomiting.  Denies any radiation of the pain in the chest.  Denies chest pain shortness of breath.  States he had a bowel movement yesterday.  Also states that the pain is worse with twisting his torso.   Flank Pain       Home Medications Prior to Admission medications   Medication Sig Start Date End Date Taking? Authorizing Provider  clindamycin (CLEOCIN) 300 MG capsule Take 1 capsule (300 mg total) by mouth 4 (four) times daily. X 7 days Patient not taking: Reported on 09/17/2023 09/21/18   Mesner, Barbara Cower, MD  cyclobenzaprine (FLEXERIL) 10 MG tablet Take 1 tablet (10 mg total) by mouth 2 (two) times daily as needed for muscle spasms. 09/30/23   Georganna Skeans, MD  doxycycline (VIBRAMYCIN) 100 MG capsule Take 1 capsule (100 mg total) by mouth 2 (two) times daily. 09/17/23   Defelice, Para March, NP  HYDROcodone-homatropine (HYCODAN) 5-1.5 MG/5ML syrup Take 5 mLs by mouth every 6 (six) hours as needed for cough. Patient not taking: Reported on 09/17/2023 08/14/18   Mardella Layman, MD  ibuprofen (ADVIL) 600 MG tablet Take 1 tablet (600 mg total) by mouth every 6 (six) hours as needed. 02/09/22   Fayrene Helper, PA-C  meloxicam (MOBIC) 7.5 MG tablet Take 1 tablet (7.5 mg total) by mouth daily. 09/30/23   Georganna Skeans, MD  nystatin (MYCOSTATIN) 100000 UNIT/ML suspension Take 5 mLs (500,000 Units total) by mouth 4 (four) times daily. 08/17/18   Janne Napoleon, NP   promethazine-dextromethorphan (PROMETHAZINE-DM) 6.25-15 MG/5ML syrup Take 5 mLs by mouth 4 (four) times daily as needed for cough. 09/17/23   Defelice, Para March, NP      Allergies    Patient has no known allergies.    Review of Systems   Review of Systems  Genitourinary:  Positive for flank pain.    Physical Exam Updated Vital Signs BP (!) 160/100 (BP Location: Left Arm)   Pulse 95   Temp 97.9 F (36.6 C) (Oral)   Ht 5\' 11"  (1.803 m)   Wt 92.5 kg   SpO2 100%   BMI 28.44 kg/m  Physical Exam Vitals and nursing note reviewed.  HENT:     Head: Normocephalic and atraumatic.     Mouth/Throat:     Mouth: Mucous membranes are moist.  Eyes:     General:        Right eye: No discharge.        Left eye: No discharge.     Conjunctiva/sclera: Conjunctivae normal.  Cardiovascular:     Rate and Rhythm: Normal rate and regular rhythm.     Pulses: Normal pulses.     Heart sounds: Normal heart sounds.  Pulmonary:     Effort: Pulmonary effort is normal.     Breath sounds: Normal breath sounds.  Abdominal:     General: Abdomen is flat. There is no distension.  Palpations: Abdomen is soft.     Tenderness: There is abdominal tenderness. There is left CVA tenderness.  Skin:    General: Skin is warm and dry.  Neurological:     General: No focal deficit present.  Psychiatric:        Mood and Affect: Mood normal.     ED Results / Procedures / Treatments   Labs (all labs ordered are listed, but only abnormal results are displayed) Labs Reviewed  CBC WITH DIFFERENTIAL/PLATELET - Abnormal; Notable for the following components:      Result Value   WBC 10.8 (*)    Neutro Abs 8.0 (*)    All other components within normal limits  COMPREHENSIVE METABOLIC PANEL WITH GFR - Abnormal; Notable for the following components:   Glucose, Bld 117 (*)    Creatinine, Ser 1.27 (*)    All other components within normal limits  LIPASE, BLOOD  URINALYSIS, ROUTINE W REFLEX MICROSCOPIC     EKG None  Radiology CT Renal Stone Study Result Date: 11/29/2023 CLINICAL DATA:  Acute left flank pain. EXAM: CT ABDOMEN AND PELVIS WITHOUT CONTRAST TECHNIQUE: Multidetector CT imaging of the abdomen and pelvis was performed following the standard protocol without IV contrast. RADIATION DOSE REDUCTION: This exam was performed according to the departmental dose-optimization program which includes automated exposure control, adjustment of the mA and/or kV according to patient size and/or use of iterative reconstruction technique. COMPARISON:  None Available. FINDINGS: Lower chest: No acute abnormality. Hepatobiliary: No focal liver abnormality is seen. No gallstones, gallbladder wall thickening, or biliary dilatation. Pancreas: Unremarkable. No pancreatic ductal dilatation or surrounding inflammatory changes. Spleen: Normal in size without focal abnormality. Adrenals/Urinary Tract: Adrenal glands are unremarkable. Kidneys are normal, without renal calculi, focal lesion, or hydronephrosis. Bladder is unremarkable. Stomach/Bowel: Stomach is within normal limits. Appendix appears normal. No evidence of bowel wall thickening, distention, or inflammatory changes. Vascular/Lymphatic: No significant vascular findings are present. No enlarged abdominal or pelvic lymph nodes. Reproductive: Prostate is unremarkable. Other: No abdominal wall hernia or abnormality. No abdominopelvic ascites. Musculoskeletal: No acute or significant osseous findings. IMPRESSION: No definite abnormality seen in the abdomen or pelvis. Electronically Signed   By: Lupita Raider M.D.   On: 11/29/2023 14:24    Procedures Procedures    Medications Ordered in ED Medications  ketorolac (TORADOL) 15 MG/ML injection 15 mg (has no administration in time range)  ondansetron (ZOFRAN) injection 4 mg (4 mg Intravenous Given 11/29/23 1243)  sodium chloride 0.9 % bolus 1,000 mL (1,000 mLs Intravenous New Bag/Given 11/29/23 1244)  morphine  (PF) 4 MG/ML injection 4 mg (4 mg Intravenous Given 11/29/23 1245)    ED Course/ Medical Decision Making/ A&P                                 Medical Decision Making Amount and/or Complexity of Data Reviewed Labs: ordered. Radiology: ordered.  Risk Prescription drug management.   Initial Impression and Ddx 28 year old well-appearing male presenting for left-sided flank pain.  Exam notable for some mild left CVA tenderness.  DDx includes pyelonephritis, kidney stone, rib fracture, pneumothorax, UTI, diverticulitis, MSK, other. Patient PMH that increases complexity of ED encounter:  none  Interpretation of Diagnostics - I independent reviewed and interpreted the labs as followed: Mild leukocytosis, elevated creatinine but normal GFR, mild hyperglycemia  - I independently visualized the following imaging with scope of interpretation limited to determining acute life threatening conditions  related to emergency care: CT renal, which revealed no acute findings  Patient Reassessment and Ultimate Disposition/Management Work up was unremarkable.  On reassessment stated his pain had improved.  Fluid challenge without issue.  Suspect MSK etiology.  Advised conservative treatment at home plus NSAIDs.  Advised to follow-up with his PCP.  Discussed return precautions.  Discharged in good condition.  Patient management required discussion with the following services or consulting groups:  None  Complexity of Problems Addressed Acute complicated illness or Injury  Additional Data Reviewed and Analyzed Further history obtained from: Past medical history and medications listed in the EMR and Prior ED visit notes  Patient Encounter Risk Assessment None         Final Clinical Impression(s) / ED Diagnoses Final diagnoses:  Left flank pain    Rx / DC Orders ED Discharge Orders     None         Gareth Eagle, PA-C 11/29/23 1438    Royanne Foots, DO 11/30/23 [phone]

## 2023-11-29 NOTE — Progress Notes (Signed)
 Patient is  having acute left side pain  10/10. Patient started less than 1hour ago

## 2023-12-03 ENCOUNTER — Encounter: Payer: Self-pay | Admitting: Family Medicine

## 2023-12-03 NOTE — Progress Notes (Signed)
 Established Patient Office Visit  Subjective    Patient ID: William Lowe, male    DOB: 1995-12-11  Age: 28 y.o. MRN: [id number]  CC: No chief complaint on file.   HPI JAUN GALLUZZO presents with acute onset of  flank/back  pain. Patient denies known trauma or injury. He denies fever/chills or viral sx. He denies GU or GI sx. He rates his sx 10+/10  Outpatient Encounter Medications as of 11/29/2023  Medication Sig   clindamycin (CLEOCIN) 300 MG capsule Take 1 capsule (300 mg total) by mouth 4 (four) times daily. X 7 days (Patient not taking: Reported on 09/17/2023)   cyclobenzaprine (FLEXERIL) 10 MG tablet Take 1 tablet (10 mg total) by mouth 2 (two) times daily as needed for muscle spasms.   doxycycline (VIBRAMYCIN) 100 MG capsule Take 1 capsule (100 mg total) by mouth 2 (two) times daily.   HYDROcodone-homatropine (HYCODAN) 5-1.5 MG/5ML syrup Take 5 mLs by mouth every 6 (six) hours as needed for cough. (Patient not taking: Reported on 09/17/2023)   ibuprofen (ADVIL) 600 MG tablet Take 1 tablet (600 mg total) by mouth every 6 (six) hours as needed.   meloxicam (MOBIC) 7.5 MG tablet Take 1 tablet (7.5 mg total) by mouth daily.   nystatin (MYCOSTATIN) 100000 UNIT/ML suspension Take 5 mLs (500,000 Units total) by mouth 4 (four) times daily.   promethazine-dextromethorphan (PROMETHAZINE-DM) 6.25-15 MG/5ML syrup Take 5 mLs by mouth 4 (four) times daily as needed for cough.   No facility-administered encounter medications on file as of 11/29/2023.    Past Medical History:  Diagnosis Date   Sickle cell trait (HCC)     History reviewed. No pertinent surgical history.  History reviewed. No pertinent family history.  Social History   Socioeconomic History   Marital status: Single    Spouse name: Not on file   Number of children: Not on file   Years of education: Not on file   Highest education level: Not on file  Occupational History   Not on file  Tobacco Use   Smoking status:  Never   Smokeless tobacco: Never  Vaping Use   Vaping status: Never Used  Substance and Sexual Activity   Alcohol use: No   Drug use: No   Sexual activity: Not on file  Other Topics Concern   Not on file  Social History Narrative   Not on file   Social Drivers of Health   Financial Resource Strain: Not on file  Food Insecurity: Not on file  Transportation Needs: Not on file  Physical Activity: Not on file  Stress: Not on file  Social Connections: Not on file  Intimate Partner Violence: Not on file    Review of Systems  All other systems reviewed and are negative.       Objective    Pulse 77   Temp 98.1 F (36.7 C) (Oral)   Resp 16   Wt 204 lb (92.5 kg)   SpO2 99%   BMI 28.45 kg/m   Physical Exam Vitals and nursing note reviewed.  Constitutional:      General: He is in acute distress.  Cardiovascular:     Rate and Rhythm: Normal rate and regular rhythm.  Pulmonary:     Effort: Pulmonary effort is normal.     Breath sounds: Normal breath sounds.  Abdominal:     Palpations: Abdomen is soft.     Tenderness: There is no abdominal tenderness.     Comments: Cva tenderness  noted.   Neurological:     General: No focal deficit present.     Mental Status: He is alert and oriented to person, place, and time.         Assessment & Plan:   Flank pain   Clinically I may suspect a kidney stone. Referred patient to ED for further eval/mgt. Patient appeared clinically stable for transport by PV  Return if symptoms worsen or fail to improve.   Tommie Raymond, MD

## 2024-03-18 ENCOUNTER — Emergency Department (HOSPITAL_COMMUNITY)

## 2024-03-18 ENCOUNTER — Encounter (HOSPITAL_COMMUNITY): Payer: Self-pay

## 2024-03-18 ENCOUNTER — Other Ambulatory Visit: Payer: Self-pay

## 2024-03-18 ENCOUNTER — Emergency Department (HOSPITAL_COMMUNITY)
Admission: EM | Admit: 2024-03-18 | Discharge: 2024-03-18 | Disposition: A | Discharge status: Home / Self Care | Attending: Emergency Medicine | Admitting: Emergency Medicine

## 2024-03-18 DIAGNOSIS — R0789 Other chest pain: Secondary | ICD-10-CM | POA: Diagnosis not present

## 2024-03-18 DIAGNOSIS — M25511 Pain in right shoulder: Secondary | ICD-10-CM | POA: Diagnosis not present

## 2024-03-18 DIAGNOSIS — I10 Essential (primary) hypertension: Secondary | ICD-10-CM | POA: Diagnosis not present

## 2024-03-18 DIAGNOSIS — R079 Chest pain, unspecified: Secondary | ICD-10-CM

## 2024-03-18 HISTORY — DX: Essential (primary) hypertension: I10

## 2024-03-18 LAB — CBC
HCT: 48 % (ref 39.0–52.0)
Hemoglobin: 15.7 g/dL (ref 13.0–17.0)
MCH: 27.1 pg (ref 26.0–34.0)
MCHC: 32.7 g/dL (ref 30.0–36.0)
MCV: 82.8 fL (ref 80.0–100.0)
Platelets: 284 K/uL (ref 150–400)
RBC: 5.8 MIL/uL (ref 4.22–5.81)
RDW: 13.2 % (ref 11.5–15.5)
WBC: 10.8 K/uL — ABNORMAL HIGH (ref 4.0–10.5)
nRBC: 0 % (ref 0.0–0.2)

## 2024-03-18 LAB — BASIC METABOLIC PANEL WITH GFR
Anion gap: 11 (ref 5–15)
BUN: 15 mg/dL (ref 6–20)
CO2: 26 mmol/L (ref 22–32)
Calcium: 9.4 mg/dL (ref 8.9–10.3)
Chloride: 101 mmol/L (ref 98–111)
Creatinine, Ser: 1.4 mg/dL — ABNORMAL HIGH (ref 0.61–1.24)
GFR, Estimated: >60 mL/min (ref >60)
Glucose, Bld: 105 mg/dL — ABNORMAL HIGH (ref 70–99)
Potassium: 3.5 mmol/L (ref 3.5–5.1)
Sodium: 138 mmol/L (ref 135–145)

## 2024-03-18 LAB — TROPONIN I (HIGH SENSITIVITY): Troponin I (High Sensitivity): 3 ng/L (ref <18)

## 2024-03-18 MED ORDER — METHOCARBAMOL 500 MG PO TABS
500.0000 mg | ORAL_TABLET | Freq: Two times a day (BID) | ORAL | 0 refills | Status: AC
  Filled 2024-03-18: qty 20, 10d supply, fill #0

## 2024-03-18 MED ORDER — NAPROXEN 500 MG PO TABS
500.0000 mg | ORAL_TABLET | Freq: Two times a day (BID) | ORAL | 0 refills | Status: AC
  Filled 2024-03-18: qty 30, 15d supply, fill #0

## 2024-03-18 MED ORDER — HYDROCODONE-ACETAMINOPHEN 5-325 MG PO TABS
1.0000 | ORAL_TABLET | Freq: Once | ORAL | Status: AC
  Administered 2024-03-18: 1 via ORAL
  Filled 2024-03-18: qty 1

## 2024-03-18 MED ORDER — KETOROLAC TROMETHAMINE 15 MG/ML IJ SOLN
15.0000 mg | Freq: Once | INTRAMUSCULAR | Status: AC
  Administered 2024-03-18: 15 mg via INTRAVENOUS
  Filled 2024-03-18: qty 1

## 2024-03-18 MED ORDER — LIDOCAINE 5 % EX PTCH
1.0000 | MEDICATED_PATCH | CUTANEOUS | Status: DC
  Administered 2024-03-18: 1 via TRANSDERMAL
  Filled 2024-03-18: qty 1

## 2024-03-18 MED ORDER — LIDOCAINE 5 % EX PTCH
1.0000 | MEDICATED_PATCH | CUTANEOUS | 0 refills | Status: AC
  Filled 2024-03-18: qty 30, 30d supply, fill #0

## 2024-03-18 NOTE — ED Notes (Signed)
 Pt refused EKG.

## 2024-03-18 NOTE — Discharge Instructions (Addendum)
 Please try anti-inflammatory, Tylenol , ice and heat with gentle stretching.  You can also use lidocaine  patch. Take Robaxin .  Return to ER with new or worsening symptoms. Follow up with PCP.

## 2024-03-18 NOTE — ED Provider Notes (Signed)
 [facility] Provider Note   CSN: [id number] Arrival date & time: 03/18/24  1734     Patient presents with: Shoulder Pain   [facility] is a 28 y.o. male.  With past medical history of sickle cell trait, hypertension presents emergency room complaining of chest pain patient is experiencing right chest pain.  States it is worse with shoulder movement side-to-side.  Denies headache or fever.  Denies any trauma.  Denies any exertional component.  Denies any lower extremity swelling.    Shoulder Pain      Prior to Admission medications   Medication Sig Start Date End Date Taking? Authorizing Provider  clindamycin  (CLEOCIN ) 300 MG capsule Take 1 capsule (300 mg total) by mouth 4 (four) times daily. X 7 days Patient not taking: Reported on 09/17/2023 09/21/18   Lowe, Selinda, MD  cyclobenzaprine  (FLEXERIL ) 10 MG tablet Take 1 tablet (10 mg total) by mouth 2 (two) times daily as needed for muscle spasms. 09/30/23   William Bleacher, MD  doxycycline  (VIBRAMYCIN ) 100 MG capsule Take 1 capsule (100 mg total) by mouth 2 (two) times daily. 09/17/23   Lowe, Jeanette, NP  HYDROcodone -homatropine (HYCODAN) 5-1.5 MG/5ML syrup Take 5 mLs by mouth every 6 (six) hours as needed for cough. Patient not taking: Reported on 09/17/2023 08/14/18   William Rogue, MD  ibuprofen  (ADVIL ) 600 MG tablet Take 1 tablet (600 mg total) by mouth every 6 (six) hours as needed. 02/09/22   William Colon, PA-C  meloxicam  (MOBIC ) 7.5 MG tablet Take 1 tablet (7.5 mg total) by mouth daily. 09/30/23   William Bleacher, MD  nystatin  (MYCOSTATIN ) 100000 UNIT/ML suspension Take 5 mLs (500,000 Units total) by mouth 4 (four) times daily. 08/17/18   William Lorrayne CHRISTELLA, NP  promethazine -dextromethorphan (PROMETHAZINE -DM) 6.25-15 MG/5ML syrup Take 5 mLs by mouth 4 (four) times daily as needed for cough. 09/17/23   Lowe, Jeanette, NP    Allergies: Patient has no known allergies.    Review of  Systems  Cardiovascular:  Positive for chest pain.  Musculoskeletal:  Positive for arthralgias.    Updated Vital Signs BP (!) 150/93   Pulse 85   Temp 98.2 F (36.8 C) (Oral)   Resp 19   Ht 5' 11 (1.803 m)   Wt 88.5 kg   SpO2 100%   BMI 27.20 kg/m   Physical Exam Vitals and nursing note reviewed.  Constitutional:      General: He is not in acute distress.    Appearance: He is not toxic-appearing.  HENT:     Head: Normocephalic and atraumatic.  Eyes:     General: No scleral icterus.    Conjunctiva/sclera: Conjunctivae normal.  Neck:     Comments: TTP over right paravertebral musculature.  No midline tenderness. No neck rigidity.  Cardiovascular:     Rate and Rhythm: Normal rate and regular rhythm.     Pulses: Normal pulses.     Heart sounds: Normal heart sounds.  Pulmonary:     Effort: Pulmonary effort is normal. No respiratory distress.     Breath sounds: Normal breath sounds.  Chest:     Chest wall: Tenderness present.  Abdominal:     General: Abdomen is flat. Bowel sounds are normal.     Palpations: Abdomen is soft.     Tenderness: There is no abdominal tenderness.  Musculoskeletal:     Right lower leg: No edema.     Left lower leg: No edema.  Skin:  General: Skin is warm and dry.     Findings: No lesion.  Neurological:     General: No focal deficit present.     Mental Status: He is alert and oriented to person, place, and time. Mental status is at baseline.     (all labs ordered are listed, but only abnormal results are displayed) Labs Reviewed  BASIC METABOLIC PANEL WITH GFR - Abnormal; Notable for the following components:      Result Value   Glucose, Bld 105 (*)    Creatinine, Ser 1.40 (*)    All other components within normal limits  CBC - Abnormal; Notable for the following components:   WBC 10.8 (*)    All other components within normal limits  TROPONIN I (HIGH SENSITIVITY)    EKG: EKG Interpretation Date/Time:  Wednesday March 18 2024  19:34:58 EDT Ventricular Rate:  71 PR Interval:  178 QRS Duration:  94 QT Interval:  351 QTC Calculation: 382 R Axis:   90  Text Interpretation: Sinus rhythm Confirmed by [facility] [phone]) on 03/18/2024 7:37:24 PM  Radiology: [facility] Shoulder Right Result Date: 03/18/2024 CLINICAL DATA:  Right shoulder pain EXAM: RIGHT SHOULDER - 3 VIEW COMPARISON:  None Available. FINDINGS: There is no evidence of fracture or dislocation. There is no evidence of arthropathy or other focal bone abnormality. Soft tissues are unremarkable. IMPRESSION: No acute osseous abnormality. Electronically Signed   By: [facility] M.D.   On: 03/18/2024 18:24     Procedures   Medications Ordered in the ED - No data to display                                  Medical Decision Making Amount and/or Complexity of Data Reviewed Labs: ordered. Radiology: ordered.  Risk Prescription drug management.   [facility] 28 y.o. presented today for chest pain. Working DDx that I considered at this time includes, but not limited to, ACS, GERD, pe, pna, aortic dissection, pneumothorax, MSK path, anemia, esophageal rupture, CHF exacerbation, valvular disorder, myocarditis, pericarditis, endocarditis, pericardial effusion/cardiac tamponade, pulmonary edema, gastritis/PUD, esophagitis.  Ddx r/o:  PE: advanced imaging will not be ordered as alternative diagnoses are more likely at this time Aortic Dissection: less likely based on the history of present illness - location, quality, onset, and severity of symptoms in this case.    Unique Tests and My Interpretation:  EKG: Rate, rhythm, axis, intervals all examined: Sinus rhythm Troponin: Within normal limits CXR: No acute findings, right x-ray shoulder with no acute findings CBC: No leukocytosis BMP: No AKI PERC negative with no recent travel, no recent significant anemia, no sign of DVT on exam.  Problem List / ED Course / Critical interventions / Medication  management  Patient reports no chest pain.  He has not had cough or fever.  Chest x-ray shows no pneumonia or pneumothorax.  Symptoms are not consistent with aortic dissection and patient has pulses equal bilaterally.  He has no signs of fluid overload on exam or chest x-ray.  EKG without any acute abnormality.  He is PERC negative thus doubt DVT or PE as cause of symptoms. Stable and well appearing.  Given reproducible tenderness on exam and pain worse with moving right arm this is most likely consistent with musculoskeletal pain.  Will trial anti-inflammatory, Tylenol  and muscle relaxer as well as lidocaine  patch, ice and heat. I ordered medication including norco  Reevaluation of the  patient after these medicines showed that the patient improved Patients vitals assessed. Upon arrival patient is  hemodynamically stable.  I have reviewed the patients home medicines and have made adjustments as needed  Plan: F/u w/ PCP to ensure resolution of sx.  Patient was given return precautions. Patient stable for discharge at this time.  Patient educated on sx/ dx and verbalized understanding of plan.  Will return to ER w/ new or worsening sx.       Final diagnoses:  Chest pain, unspecified type    ED Discharge Orders          Ordered    methocarbamol  (ROBAXIN ) 500 MG tablet  2 times daily        03/18/24 2016    naproxen  (NAPROSYN ) 500 MG tablet  2 times daily        03/18/24 2016    lidocaine  (LIDODERM ) 5 %  Every 24 hours        03/18/24 2016               Andre Swander, Warren SAILOR, PA-C 03/18/24 2017    William Cornet, DO 03/18/24 2253

## 2024-03-18 NOTE — ED Triage Notes (Signed)
 Patient has right shoulder pain that moves to his upper back along with right sided neck pain. Pain started yesterday. Denies falls.

## 2024-03-19 ENCOUNTER — Ambulatory Visit: Payer: Self-pay

## 2024-03-19 ENCOUNTER — Other Ambulatory Visit (HOSPITAL_COMMUNITY): Payer: Self-pay

## 2024-04-20 ENCOUNTER — Ambulatory Visit: Payer: Self-pay

## 2024-04-20 NOTE — Telephone Encounter (Signed)
 FYI Only or Action Required?: Action required by provider: request for appointment. Pt will go to UC - but would like to be seen in office.  Patient was last seen in primary care on 11/29/2023 by Tanda Bleacher, MD.  Called Nurse Triage reporting Headache. - High BP readings - Pt does not know actual numbers.  Symptoms began several days ago.  Interventions attempted: OTC medications: Tylenol  and Excedrin.  Symptoms are: unchanged.  Triage Disposition: See Physician Within 24 Hours  Patient/caregiver understands and will follow disposition?: Yes                    Copied from CRM #[id number]. Topic: Clinical - Red Word Triage >> Apr 20, 2024 10:21 AM [facility] wrote: Red Word that prompted transfer to Nurse Triage: headache with high blood pressure, girlfriend is a nurse and she said his bp was high Reason for Disposition  [1] MODERATE headache (e.g., interferes with normal activities) AND [2] present > 24 hours AND [3] unexplained  (Exceptions: Pain medicines not tried, typical migraine, or headache part of viral illness.)  Answer Assessment - Initial Assessment Questions 1. LOCATION: Where does it hurt?      Front /forehead 2. ONSET: When did the headache start? (e.g., minutes, hours, days)      Friday night 3. PATTERN: Does the pain come and go, or has it been constant since it started?     constant 4. SEVERITY: How bad is the pain? and What does it keep you from doing?  (e.g., Scale 1-10; mild, moderate, or severe)     8/10 5. RECURRENT SYMPTOM: Have you ever had headaches before? If Yes, ask: When was the last time? and What happened that time?      yes 6. CAUSE: What do you think is causing the headache?     Possible High BP 7. MIGRAINE: Have you been diagnosed with migraine headaches? If Yes, ask: Is this headache similar?      no 8. HEAD INJURY: Has there been any recent injury to your head?      no 9. OTHER SYMPTOMS: Do you have  any other symptoms? (e.g., fever, stiff neck, eye pain, sore throat, cold symptoms)     Nose burns - high BP  Protocols used: Headache-A-AH

## 2024-04-23 ENCOUNTER — Other Ambulatory Visit (HOSPITAL_COMMUNITY): Payer: Self-pay

## 2024-04-23 ENCOUNTER — Encounter: Payer: Self-pay | Admitting: Family Medicine

## 2024-04-23 ENCOUNTER — Ambulatory Visit (INDEPENDENT_AMBULATORY_CARE_PROVIDER_SITE_OTHER): Admitting: Family Medicine

## 2024-04-23 VITALS — BP 138/79 | HR 71 | Ht 71.0 in | Wt 195.0 lb

## 2024-04-23 DIAGNOSIS — I10 Essential (primary) hypertension: Secondary | ICD-10-CM

## 2024-04-23 MED ORDER — HYDROCHLOROTHIAZIDE 12.5 MG PO CAPS
12.5000 mg | ORAL_CAPSULE | Freq: Every day | ORAL | 0 refills | Status: AC
  Filled 2024-04-23: qty 90, 90d supply, fill #0

## 2024-04-23 NOTE — Progress Notes (Signed)
 Established Patient Office Visit  Subjective    Patient ID: [facility], male    DOB: 1996/03/08  Age: 28 y.o. MRN: [id number]  CC:  Chief Complaint  Patient presents with   Medical Management of Chronic Issues    Pt reports his head has been hurting the past 2 days.     HPI [facility] presents for follow up elevated blood pressure. Patient reports that he has had intermittent elevated readings outside of the office.   Outpatient Encounter Medications as of 04/23/2024  Medication Sig   doxycycline  (VIBRAMYCIN ) 100 MG capsule Take 1 capsule (100 mg total) by mouth 2 (two) times daily.   hydrochlorothiazide  (MICROZIDE ) 12.5 MG capsule Take 1 capsule (12.5 mg total) by mouth daily.   HYDROcodone -homatropine (HYCODAN) 5-1.5 MG/5ML syrup Take 5 mLs by mouth every 6 (six) hours as needed for cough.   lidocaine  (LIDODERM ) 5 % Place 1 patch onto the skin daily. Remove & Discard patch within 12 hours or as directed by MD   methocarbamol  (ROBAXIN ) 500 MG tablet Take 1 tablet (500 mg total) by mouth 2 (two) times daily.   naproxen  (NAPROSYN ) 500 MG tablet Take 1 tablet (500 mg total) by mouth 2 (two) times daily.   promethazine -dextromethorphan (PROMETHAZINE -DM) 6.25-15 MG/5ML syrup Take 5 mLs by mouth 4 (four) times daily as needed for cough.   clindamycin  (CLEOCIN ) 300 MG capsule Take 1 capsule (300 mg total) by mouth 4 (four) times daily. X 7 days (Patient not taking: Reported on 09/17/2023)   nystatin  (MYCOSTATIN ) 100000 UNIT/ML suspension Take 5 mLs (500,000 Units total) by mouth 4 (four) times daily. (Patient not taking: Reported on 04/23/2024)   No facility-administered encounter medications on file as of 04/23/2024.    Past Medical History:  Diagnosis Date   Hypertension    Sickle cell trait (HCC)     History reviewed. No pertinent surgical history.  History reviewed. No pertinent family history.  Social History   Socioeconomic History   Marital status: Single    Spouse  name: Not on file   Number of children: Not on file   Years of education: Not on file   Highest education level: Not on file  Occupational History   Not on file  Tobacco Use   Smoking status: Never   Smokeless tobacco: Never  Vaping Use   Vaping status: Never Used  Substance and Sexual Activity   Alcohol use: No   Drug use: No   Sexual activity: Not on file  Other Topics Concern   Not on file  Social History Narrative   Not on file   Social Drivers of Health   Financial Resource Strain: Not on file  Food Insecurity: Not on file  Transportation Needs: Not on file  Physical Activity: Not on file  Stress: Not on file  Social Connections: Not on file  Intimate Partner Violence: Not on file    Review of Systems  All other systems reviewed and are negative.       Objective    BP 138/79   Pulse 71   Ht 5' 11 (1.803 m)   Wt 195 lb (88.5 kg)   SpO2 95%   BMI 27.20 kg/m   Physical Exam Vitals and nursing note reviewed.  Constitutional:      General: He is not in acute distress. Cardiovascular:     Rate and Rhythm: Normal rate and regular rhythm.  Pulmonary:     Effort: Pulmonary effort is normal.  Breath sounds: Normal breath sounds.  Abdominal:     Palpations: Abdomen is soft.     Tenderness: There is no abdominal tenderness.  Neurological:     General: No focal deficit present.     Mental Status: He is alert and oriented to person, place, and time.         Assessment & Plan:   Essential hypertension  Other orders -     hydroCHLOROthiazide ; Take 1 capsule (12.5 mg total) by mouth daily.  Dispense: 90 capsule; Refill: 0   Patient prefers to start agent at this time.   Return in about 3 months (around 07/24/2024) for follow up, chronic med issues.   Tanda Raguel SQUIBB, MD

## 2024-07-23 ENCOUNTER — Ambulatory Visit: Admitting: Family Medicine

## 2024-08-05 ENCOUNTER — Encounter: Payer: Self-pay | Admitting: Emergency Medicine

## 2024-08-05 ENCOUNTER — Other Ambulatory Visit (HOSPITAL_COMMUNITY): Payer: Self-pay

## 2024-08-05 ENCOUNTER — Ambulatory Visit: Admission: EM | Admit: 2024-08-05 | Discharge: 2024-08-05 | Disposition: A | Discharge status: Home / Self Care

## 2024-08-05 DIAGNOSIS — K529 Noninfective gastroenteritis and colitis, unspecified: Secondary | ICD-10-CM

## 2024-08-05 MED ORDER — ONDANSETRON HCL 8 MG PO TABS
8.0000 mg | ORAL_TABLET | Freq: Three times a day (TID) | ORAL | 0 refills | Status: AC | PRN
  Filled 2024-08-05: qty 20, 7d supply, fill #0

## 2024-08-05 NOTE — ED Triage Notes (Signed)
 Pt presents c/o stomach pain (lower center), fever, headache, and emesis x 2 days. Pt states,  Yesterday, my stomach was in knots. I threw up like twice and I had a bad headache. I had diarrhea too.  Pt denies any additional sxs.

## 2024-08-05 NOTE — ED Provider Notes (Signed)
 [facility]    CSN: [id number] Arrival date & time: 08/05/24  1558      History   Chief Complaint Chief Complaint  Patient presents with   Fever   Headache   Abdominal Pain    L center abd     HPI [facility] is a 28 y.o. male.   Pt presents today due to 2 days of nausea, vomiting, diarrhea, and abdominal pain. Pt states that his highest temp at home was 100.1. Pt states that he started his symptoms started ar 4:30 am on Tuesday. Pt states that 16 hrs before symptoms started he ate a burger from a burger place in [place]. Pt states that he has had 5 episodes of diarrhea and 2 episodes of vomiting.   The history is provided by the patient.  Fever Associated symptoms: headaches   Headache Associated symptoms: abdominal pain and fever   Abdominal Pain Associated symptoms: fever     Past Medical History:  Diagnosis Date   Hypertension    Sickle cell trait     Patient Active Problem List   Diagnosis Date Noted   Acute respiratory infection 09/17/2023   Acute cough 09/17/2023   Elevated blood pressure reading 09/17/2023   Back pain 11/10/2020    History reviewed. No pertinent surgical history.     Home Medications    Prior to Admission medications   Medication Sig Start Date End Date Taking? Authorizing Provider  ondansetron  (ZOFRAN ) 8 MG tablet Take 1 tablet (8 mg total) by mouth every 8 (eight) hours as needed for nausea or vomiting. 08/05/24  Yes Andra Corean BROCKS, PA-C  doxycycline  (VIBRAMYCIN ) 100 MG capsule Take 1 capsule (100 mg total) by mouth 2 (two) times daily. 09/17/23   Defelice, Jeanette, NP  hydrochlorothiazide  (MICROZIDE ) 12.5 MG capsule Take 1 capsule (12.5 mg total) by mouth daily. 04/23/24   Tanda Bleacher, MD  HYDROcodone -homatropine (HYCODAN) 5-1.5 MG/5ML syrup Take 5 mLs by mouth every 6 (six) hours as needed for cough. 08/14/18   Rolinda Rogue, MD  lidocaine  (LIDODERM ) 5 % Place 1 patch onto the skin daily. Remove & Discard  patch within 12 hours or as directed by MD 03/18/24   Barrett, Warren SAILOR, PA-C  methocarbamol  (ROBAXIN ) 500 MG tablet Take 1 tablet (500 mg total) by mouth 2 (two) times daily. 03/18/24   Barrett, Warren SAILOR, PA-C  naproxen  (NAPROSYN ) 500 MG tablet Take 1 tablet (500 mg total) by mouth 2 (two) times daily. 03/18/24   Barrett, Warren SAILOR, PA-C  promethazine -dextromethorphan (PROMETHAZINE -DM) 6.25-15 MG/5ML syrup Take 5 mLs by mouth 4 (four) times daily as needed for cough. 09/17/23   Defelice, Rilla, NP    Family History History reviewed. No pertinent family history.  Social History Social History   Tobacco Use   Smoking status: Never    Passive exposure: Never   Smokeless tobacco: Never  Vaping Use   Vaping status: Never Used  Substance Use Topics   Alcohol use: No   Drug use: No     Allergies   Patient has no known allergies.   Review of Systems Review of Systems  Constitutional:  Positive for fever.  Gastrointestinal:  Positive for abdominal pain.  Neurological:  Positive for headaches.     Physical Exam Triage Vital Signs ED Triage Vitals  Encounter Vitals Group     BP 08/05/24 1750 (!) 157/87     Girls Systolic BP Percentile --      Girls Diastolic BP Percentile --  Boys Systolic BP Percentile --      Boys Diastolic BP Percentile --      Pulse Rate 08/05/24 1750 81     Resp 08/05/24 1750 18     Temp 08/05/24 1750 97.9 F (36.6 C)     Temp Source 08/05/24 1750 Oral     SpO2 08/05/24 1750 98 %     Weight 08/05/24 1748 195 lb 1.7 oz (88.5 kg)     Height --      Head Circumference --      Peak Flow --      Pain Score 08/05/24 1748 0     Pain Loc --      Pain Education --      Exclude from Growth Chart --    No data found.  Updated Vital Signs BP (!) 157/87 (BP Location: Left Arm)   Pulse 81   Temp 97.9 F (36.6 C) (Oral)   Resp 18   Wt 195 lb 1.7 oz (88.5 kg)   SpO2 98%   BMI 27.21 kg/m   Visual Acuity Right Eye Distance:   Left Eye Distance:    Bilateral Distance:    Right Eye Near:   Left Eye Near:    Bilateral Near:     Physical Exam Vitals and nursing note reviewed.  Constitutional:      General: He is not in acute distress.    Appearance: Normal appearance. He is not ill-appearing, toxic-appearing or diaphoretic.  Eyes:     General: No scleral icterus. Cardiovascular:     Rate and Rhythm: Normal rate and regular rhythm.     Heart sounds: Normal heart sounds.  Pulmonary:     Effort: Pulmonary effort is normal. No respiratory distress.     Breath sounds: Normal breath sounds. No wheezing or rhonchi.  Abdominal:     General: Abdomen is flat. Bowel sounds are normal.     Palpations: Abdomen is soft.     Tenderness: There is no abdominal tenderness. There is no right CVA tenderness or left CVA tenderness.  Skin:    General: Skin is warm.  Neurological:     Mental Status: He is alert and oriented to person, place, and time.  Psychiatric:        Mood and Affect: Mood normal.        Behavior: Behavior normal.      UC Treatments / Results  Labs (all labs ordered are listed, but only abnormal results are displayed) Labs Reviewed - No data to display  EKG   Radiology No results found.  Procedures Procedures (including critical care time)  Medications Ordered in UC Medications - No data to display  Initial Impression / Assessment and Plan / UC Course  I have reviewed the triage vital signs and the nursing notes.  Pertinent labs & imaging results that were available during my care of the patient were reviewed by me and considered in my medical decision making (see chart for details).    Final Clinical Impressions(s) / UC Diagnoses   Final diagnoses:  Acute gastroenteritis     Discharge Instructions      You have been diagnosed with a gastrointestinal issue today with symptoms of nausea, vomiting, and/or diarrhea.  It is best that you eat a bland diet which includes dry toast, applesauce, bananas,  chicken broth, plain rice, or plain mashed potatoes eat these things with no salt-and-pepper, excessive butter, or other seasonings.  It is also important to drink plenty  of fluids as you are losing a lot of electrolytes due to vomiting and having diarrhea.  Diluted Gatorade, water, Pedialyte, liquid IV, etc. are good choices for fluid intake.  You may also use popsicles and/or Italian ice.  It is recommended that you do not use anything to stop your diarrhea as it is your body's way of getting rid of the offending bug.  If your symptoms are not improving within 3 to 5 days, you are experiencing significant fatigue, or you are urinating less frequently you will need to follow-up with your PCP or report to the ER.     ED Prescriptions     Medication Sig Dispense Auth. Provider   ondansetron  (ZOFRAN ) 8 MG tablet Take 1 tablet (8 mg total) by mouth every 8 (eight) hours as needed for nausea or vomiting. 20 tablet Andra Corean BROCKS, PA-C      PDMP not reviewed this encounter.   Andra Corean BROCKS, PA-C 08/05/24 1821

## 2024-08-05 NOTE — Discharge Instructions (Signed)

## 2024-08-06 ENCOUNTER — Other Ambulatory Visit (HOSPITAL_COMMUNITY): Payer: Self-pay

## 2024-08-18 ENCOUNTER — Other Ambulatory Visit (HOSPITAL_COMMUNITY): Payer: Self-pay
